# Patient Record
Sex: Female | Born: 1985
Health system: Southern US, Community
[De-identification: ages and names within clinical notes are randomized; demographics above are authoritative.]

## PROBLEM LIST (undated history)

## (undated) DIAGNOSIS — I059 Rheumatic mitral valve disease, unspecified: Secondary | ICD-10-CM

## (undated) DIAGNOSIS — R002 Palpitations: Secondary | ICD-10-CM

## (undated) DIAGNOSIS — R42 Dizziness and giddiness: Secondary | ICD-10-CM

## (undated) DIAGNOSIS — I10 Essential (primary) hypertension: Secondary | ICD-10-CM

## (undated) DIAGNOSIS — G479 Sleep disorder, unspecified: Secondary | ICD-10-CM

## (undated) HISTORY — DX: Essential (primary) hypertension: I10

## (undated) HISTORY — DX: Palpitations: R00.2

## (undated) HISTORY — DX: Rheumatic mitral valve disease, unspecified: I05.9

## (undated) HISTORY — DX: Dizziness and giddiness: R42

## (undated) HISTORY — DX: Sleep disorder, unspecified: G47.9

---
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
## 2005-08-05 ENCOUNTER — Emergency Department (HOSPITAL_COMMUNITY): Admission: EM | Admit: 2005-08-05 | Discharge: 2005-08-06 | Payer: Self-pay | Admitting: Emergency Medicine

## 2006-07-04 ENCOUNTER — Emergency Department (HOSPITAL_COMMUNITY): Admission: EM | Admit: 2006-07-04 | Discharge: 2006-07-04 | Payer: Self-pay | Admitting: Emergency Medicine

## 2006-07-10 ENCOUNTER — Emergency Department (HOSPITAL_COMMUNITY): Admission: EM | Admit: 2006-07-10 | Discharge: 2006-07-10 | Payer: Self-pay | Admitting: Family Medicine

## 2008-10-21 ENCOUNTER — Emergency Department (HOSPITAL_COMMUNITY): Admission: EM | Admit: 2008-10-21 | Discharge: 2008-10-21 | Payer: Self-pay | Admitting: Family Medicine

## 2010-03-25 ENCOUNTER — Ambulatory Visit: Payer: Self-pay | Admitting: Psychiatry

## 2010-03-25 ENCOUNTER — Inpatient Hospital Stay (HOSPITAL_COMMUNITY): Admission: RE | Admit: 2010-03-25 | Discharge: 2010-03-28 | Payer: Self-pay | Admitting: Psychiatry

## 2010-05-03 ENCOUNTER — Emergency Department (HOSPITAL_COMMUNITY): Admission: EM | Admit: 2010-05-03 | Discharge: 2010-05-03 | Payer: Self-pay | Admitting: Family Medicine

## 2010-11-30 LAB — URINE MICROSCOPIC-ADD ON

## 2010-11-30 LAB — CBC
HCT: 39.8 % (ref 36.0–46.0)
Hemoglobin: 13.9 g/dL (ref 12.0–15.0)
MCH: 33.7 pg (ref 26.0–34.0)
MCHC: 34.8 g/dL (ref 30.0–36.0)
MCV: 96.9 fL (ref 78.0–100.0)
Platelets: 215 10*3/uL (ref 150–400)
RBC: 4.11 MIL/uL (ref 3.87–5.11)
RDW: 12.6 % (ref 11.5–15.5)
WBC: 4.8 10*3/uL (ref 4.0–10.5)

## 2010-11-30 LAB — COMPREHENSIVE METABOLIC PANEL
ALT: 16 U/L (ref 0–35)
AST: 21 U/L (ref 0–37)
Albumin: 4.1 g/dL (ref 3.5–5.2)
Alkaline Phosphatase: 62 U/L (ref 39–117)
BUN: 13 mg/dL (ref 6–23)
CO2: 25 mEq/L (ref 19–32)
Calcium: 8.9 mg/dL (ref 8.4–10.5)
Chloride: 104 mEq/L (ref 96–112)
Creatinine, Ser: 0.93 mg/dL (ref 0.4–1.2)
GFR calc Af Amer: >60 mL/min (ref >60)
GFR calc non Af Amer: >60 mL/min (ref >60)
Glucose, Bld: 89 mg/dL (ref 70–99)
Potassium: 3.7 mEq/L (ref 3.5–5.1)
Sodium: 136 mEq/L (ref 135–145)
Total Bilirubin: 2.2 mg/dL — ABNORMAL HIGH (ref 0.3–1.2)
Total Protein: 7.1 g/dL (ref 6.0–8.3)

## 2010-11-30 LAB — URINALYSIS, ROUTINE W REFLEX MICROSCOPIC
Bilirubin Urine: NEGATIVE
Glucose, UA: NEGATIVE mg/dL
Hgb urine dipstick: NEGATIVE
Nitrite: NEGATIVE
Protein, ur: NEGATIVE mg/dL
Specific Gravity, Urine: 1.02 (ref 1.005–1.030)
Urobilinogen, UA: 0.2 mg/dL (ref 0.0–1.0)
pH: 6 (ref 5.0–8.0)

## 2010-11-30 LAB — URINE DRUGS OF ABUSE SCREEN W ALC, ROUTINE (REF LAB)
Amphetamine Screen, Ur: NEGATIVE
Barbiturate Quant, Ur: NEGATIVE
Benzodiazepines.: NEGATIVE
Cocaine Metabolites: NEGATIVE
Creatinine,U: 200.5 mg/dL
Ethyl Alcohol: <10 mg/dL (ref <10)
Marijuana Metabolite: NEGATIVE
Methadone: NEGATIVE
Opiate Screen, Urine: NEGATIVE
Phencyclidine (PCP): NEGATIVE
Propoxyphene: NEGATIVE

## 2010-11-30 LAB — TSH: TSH: 2.931 u[IU]/mL (ref 0.350–4.500)

## 2010-11-30 LAB — PREGNANCY, URINE: Preg Test, Ur: NEGATIVE

## 2017-02-02 ENCOUNTER — Other Ambulatory Visit (HOSPITAL_COMMUNITY)
Admission: RE | Admit: 2017-02-02 | Discharge: 2017-02-02 | Disposition: A | Discharge status: Home / Self Care | Payer: 59 | Source: Ambulatory Visit | Attending: Family Medicine | Admitting: Family Medicine

## 2017-02-02 ENCOUNTER — Other Ambulatory Visit: Payer: Self-pay | Admitting: Family Medicine

## 2017-02-02 DIAGNOSIS — Z124 Encounter for screening for malignant neoplasm of cervix: Secondary | ICD-10-CM | POA: Insufficient documentation

## 2017-02-03 DIAGNOSIS — Z13220 Encounter for screening for lipoid disorders: Secondary | ICD-10-CM | POA: Diagnosis not present

## 2017-02-03 DIAGNOSIS — Z01419 Encounter for gynecological examination (general) (routine) without abnormal findings: Secondary | ICD-10-CM | POA: Diagnosis not present

## 2017-02-04 ENCOUNTER — Other Ambulatory Visit: Payer: Self-pay | Admitting: Family Medicine

## 2017-02-04 DIAGNOSIS — N6012 Diffuse cystic mastopathy of left breast: Secondary | ICD-10-CM

## 2017-02-05 ENCOUNTER — Other Ambulatory Visit: Payer: Self-pay | Admitting: Family Medicine

## 2017-02-05 DIAGNOSIS — N6012 Diffuse cystic mastopathy of left breast: Secondary | ICD-10-CM

## 2017-02-05 LAB — CYTOLOGY - PAP
Chlamydia: NEGATIVE
Diagnosis: NEGATIVE
HPV: NOT DETECTED
Neisseria Gonorrhea: NEGATIVE

## 2017-08-04 ENCOUNTER — Ambulatory Visit
Admission: RE | Admit: 2017-08-04 | Discharge: 2017-08-04 | Disposition: A | Discharge status: Home / Self Care | Payer: 59 | Source: Ambulatory Visit | Attending: Family Medicine | Admitting: Family Medicine

## 2017-08-04 DIAGNOSIS — N6489 Other specified disorders of breast: Secondary | ICD-10-CM | POA: Diagnosis not present

## 2017-08-04 DIAGNOSIS — R922 Inconclusive mammogram: Secondary | ICD-10-CM | POA: Diagnosis not present

## 2017-08-04 DIAGNOSIS — N6012 Diffuse cystic mastopathy of left breast: Secondary | ICD-10-CM

## 2018-08-24 ENCOUNTER — Ambulatory Visit (HOSPITAL_COMMUNITY)
Admission: EM | Admit: 2018-08-24 | Discharge: 2018-08-24 | Disposition: A | Discharge status: Home / Self Care | Payer: 59 | Attending: Family Medicine | Admitting: Family Medicine

## 2018-08-24 ENCOUNTER — Encounter (HOSPITAL_COMMUNITY): Payer: Self-pay

## 2018-08-24 DIAGNOSIS — B351 Tinea unguium: Secondary | ICD-10-CM | POA: Insufficient documentation

## 2018-08-24 DIAGNOSIS — M21611 Bunion of right foot: Secondary | ICD-10-CM | POA: Insufficient documentation

## 2018-08-24 DIAGNOSIS — R21 Rash and other nonspecific skin eruption: Secondary | ICD-10-CM | POA: Insufficient documentation

## 2018-08-24 MED ORDER — NAPROXEN 500 MG PO TABS: 500.0000 mg | ORAL_TABLET | Freq: Two times a day (BID) | ORAL | 0 refills | Status: DC

## 2018-08-24 NOTE — ED Notes (Signed)
Patient able to ambulate independently  

## 2018-08-24 NOTE — ED Triage Notes (Signed)
Pt present rash that has spread on different locations of her body.  Pt present right foot swelling and painful. That started hurting two days ago.  Pt foot has been previous injured 2  years ago from a pallet falling on her foot.

## 2018-08-31 NOTE — ED Provider Notes (Signed)
[facility]   [id number] 08/24/18 Arrival Time: 1557  ASSESSMENT & PLAN:  1. Bunion of great toe of right foot   2. Rash and nonspecific skin eruption   3. Onychomycosis    Question if rash is the first sign of pityriasis rosea. Written information given. Low suspicion for scabies. Discussed.  No indication for imaging at this time concerning R foot. Low suspicion for stress fx. Likely all related to bunion.  Meds ordered this encounter  Medications  . naproxen (NAPROSYN) 500 MG tablet    Sig: Take 1 tablet (500 mg total) by mouth 2 (two) times daily.    Dispense:  20 tablet    Refill:  0  May try wearing different shoes that put less pressure on her bunion. No need for imaging of foot today. Discussed.  Recommend f/u with PCP to discuss treatment of onychomycosis if she desires. She may benefit from a podiatry referral.  Follow-up Information    Hoyt KochYousef, Deema, MD.   Specialty:  Family Medicine Why:  As needed. Contact information: [address] [place] [address] [phone]          May otherwise f/u here if needed. Reviewed expectations re: course of current medical issues. Questions answered. Outlined signs and symptoms indicating need for more acute intervention. Patient verbalized understanding. After Visit Summary given.  SUBJECTIVE: History from: patient. Lisa MillinerKendra Clarke is a 32 y.o. female who reports:  1) "A rash that I noticed a few days ago." Mostly on lower abdomen/back. Very mild itching. Has not spread rapidly. Afebrile. No new exposures to her skin. Mild cold symptoms a week or so ago. Resolved. No associated pain. No sick contacts. No h/o similar rash. No abdominal pain or urinary symptoms. No specific aggravating or alleviating factors reported. No self/OTC treatment.  2) R foot. Painful. Some swelling reported. Reports distant injury to this foot two years ago when a pallet fell onto her foot. No recent injury or trauma.  Ambulatory without difficulty. A little worse after prolonged standing/walking. Better with rest. Wearing new work boots for the past few weeks. "Takes a while to break in a new pair." No extremity sensation changes or weakness. No OTC treatment.  3) Questions discoloration of her L great toenail. Present for many months. No worsening. No pain reported. No skin changes or lesions. No injury to this toe.  History reviewed. No pertinent surgical history.   ROS: As per HPI. All other systems negative.  OBJECTIVE:  Vitals:   08/24/18 1703  BP: (!) 149/86  Pulse: 76  Resp: 16  Temp: 98.8 F (37.1 C)  TempSrc: Oral  SpO2: 99%    General appearance: alert; no distress HEENT: [place]; AT; oropharynx moist and without lesions Neck: supple without LAD Lungs: CTAB Abd: soft; non-tender Extremities: . RLE: warm and well perfused; no tenderness to palpation; obvious bunion without swelling or erythema; with no bruising; ROM CV: brisk extremity capillary refill of RLE and LLE; 2+ DP and PT pulse of RLE. . LLE: warm and well perfused; onychomycosis of L great nail; nail is not brittle and is intact; no signs of infection Skin: warm and dry; 4-5 oval, sharply delimited, pink papules on trunk oriented along skin lines of lower back; all less than 1cm in size; none on extremities or face/scalp Neurologic: gait normal; normal sensation of RLE and LLE; normal strength of RLE and LLE Psychological: alert and cooperative; normal mood and affect  Allergies  Allergen Reactions  .  Penicillins Hives   PMH: R foot injury as in HPI.  Social History   Socioeconomic History  . Marital status: Single    Spouse name: Not on file  . Number of children: Not on file  . Years of education: Not on file  . Highest education level: Not on file  Occupational History  . Not on file  Social Needs  . Financial resource strain: Not on file  . Food insecurity:    Worry: Not on file    Inability: Not on file  .  Transportation needs:    Medical: Not on file    Non-medical: Not on file  Tobacco Use  . Smoking status: Never Smoker  . Smokeless tobacco: Never Used  Substance and Sexual Activity  . Alcohol use: Never    Frequency: Never  . Drug use: Never  . Sexual activity: Yes  Lifestyle  . Physical activity:    Days per week: Not on file    Minutes per session: Not on file  . Stress: Not on file  Relationships  . Social connections:    Talks on phone: Not on file    Gets together: Not on file    Attends religious service: Not on file    Active member of club or organization: Not on file    Attends meetings of clubs or organizations: Not on file    Relationship status: Not on file  Other Topics Concern  . Not on file  Social History Narrative  . Not on file   Family History  Problem Relation Age of Onset  . Hypertension Mother    History reviewed. No pertinent surgical history.    Mardella Layman, MD 09/08/18 [phone]

## 2018-09-06 ENCOUNTER — Encounter (HOSPITAL_COMMUNITY): Payer: Self-pay | Admitting: *Deleted

## 2018-09-06 ENCOUNTER — Ambulatory Visit (HOSPITAL_COMMUNITY)
Admission: EM | Admit: 2018-09-06 | Discharge: 2018-09-06 | Disposition: A | Discharge status: Home / Self Care | Payer: 59 | Attending: Family Medicine | Admitting: Family Medicine

## 2018-09-06 DIAGNOSIS — L42 Pityriasis rosea: Secondary | ICD-10-CM | POA: Insufficient documentation

## 2018-09-06 DIAGNOSIS — B351 Tinea unguium: Secondary | ICD-10-CM | POA: Diagnosis not present

## 2018-09-06 MED ORDER — TRIAMCINOLONE ACETONIDE 0.1 % EX CREA: 1.0000 "application " | TOPICAL_CREAM | Freq: Two times a day (BID) | CUTANEOUS | 1 refills | Status: DC | PRN

## 2018-09-06 MED ORDER — TERBINAFINE HCL 250 MG PO TABS: 250.0000 mg | ORAL_TABLET | Freq: Every day | ORAL | 2 refills | Status: DC

## 2018-09-06 NOTE — ED Provider Notes (Signed)
[facility]    CSN: [id number] Arrival date & time: 09/06/18  1656     History   Chief Complaint Chief Complaint  Patient presents with  . Rash    HPI Lisa MillinerKendra Clarke is a 32 y.o. female.   [facility].  States number of lesions is now increasing and starting to become pruritic.  Patient also has a problem with her right great toenail where she dropped something heavy on it and the nail subsequently came off.  Then it became thick and difficult to trim.     History reviewed. No pertinent past medical history.  There are no active problems to display for this patient.   History reviewed. No pertinent surgical history.  OB History   No obstetric history on file.      Home Medications    Prior to Admission medications   Medication Sig Start Date End Date Taking? Authorizing Provider  terbinafine (LAMISIL) 250 MG tablet Take 1 tablet (250 mg total) by mouth daily. 09/06/18   Elvina SidleLauenstein, Pollyann Roa, MD  triamcinolone cream (KENALOG) 0.1 % Apply 1 application topically 2 (two) times daily as needed. 09/06/18   Elvina SidleLauenstein, Alphons Burgert, MD    Family History Family History  Problem Relation Age of Onset  . Hypertension Mother     Social History Social History   Tobacco Use  . Smoking status: Never Smoker  . Smokeless tobacco: Never Used  Substance Use Topics  . Alcohol use: Never    Frequency: Never  . Drug use: Never     Allergies   Penicillins   Review of Systems Review of Systems   Physical Exam Triage Vital Signs ED Triage Vitals [09/06/18 1715]  Enc Vitals Group     BP (!) 156/82     Pulse Rate 67     Resp 16     Temp 98.3 F (36.8 C)     Temp Source Oral     SpO2 100 %     Weight      Height      Head Circumference      Peak Flow      Pain Score 0     Pain Loc      Pain Edu?      Excl. in GC?    No data found.  Updated Vital Signs BP (!) 156/82   Pulse 67   Temp 98.3 F (36.8 C) (Oral)   Resp 16   LMP  08/23/2018 (Approximate)   SpO2 100%   Physical Exam Vitals signs and nursing note reviewed.  Constitutional:      Appearance: Normal appearance.  Eyes:     Conjunctiva/sclera: Conjunctivae normal.  Pulmonary:     Effort: Pulmonary effort is normal.  Musculoskeletal: Normal range of motion.  Skin:    Findings: Rash present.     Comments: Great toenail is thickened and deformed on the right  Neurological:     General: No focal deficit present.     Mental Status: She is alert.  Psychiatric:        Mood and Affect: Mood normal.        UC Treatments / Results  Labs (all labs ordered are listed, but only abnormal results are displayed) Labs Reviewed - No data to display  EKG None  Radiology No results found.  Procedures Procedures (including critical care time)  Medications Ordered in UC Medications - No data to display  Initial Impression / Assessment and  Plan / UC Course  I have reviewed the triage vital signs and the nursing notes.  Pertinent labs & imaging results that were available during my care of the patient were reviewed by me and considered in my medical decision making (see chart for details).    Final Clinical Impressions(s) / UC Diagnoses   Final diagnoses:  Tinea unguium  Pityriasis rosea   Discharge Instructions   None    ED Prescriptions    Medication Sig Dispense Auth. Provider   terbinafine (LAMISIL) 250 MG tablet Take 1 tablet (250 mg total) by mouth daily. 30 tablet Elvina SidleLauenstein, Liliya Fullenwider, MD   triamcinolone cream (KENALOG) 0.1 % Apply 1 application topically 2 (two) times daily as needed. 80 g Elvina SidleLauenstein, Donda Friedli, MD     Controlled Substance Prescriptions  Controlled Substance Registry consulted? Not Applicable   Elvina SidleLauenstein, Mitcheal Sweetin, MD 09/06/18 1742

## 2018-09-06 NOTE — ED Triage Notes (Addendum)
[facility].  States number of lesions is now increasing and starting to become pruritic.

## 2019-03-03 ENCOUNTER — Other Ambulatory Visit: Payer: Self-pay

## 2019-03-03 DIAGNOSIS — Z20822 Contact with and (suspected) exposure to covid-19: Secondary | ICD-10-CM

## 2019-03-03 NOTE — Progress Notes (Signed)
lab

## 2019-03-05 LAB — NOVEL CORONAVIRUS, NAA: SARS-CoV-2, NAA: NOT DETECTED

## 2020-02-29 ENCOUNTER — Encounter: Payer: Self-pay | Admitting: General Practice

## 2020-05-14 ENCOUNTER — Encounter: Payer: Self-pay | Admitting: Cardiology

## 2020-05-14 ENCOUNTER — Ambulatory Visit: Payer: 59 | Admitting: Cardiology

## 2020-05-14 ENCOUNTER — Telehealth: Payer: Self-pay | Admitting: Radiology

## 2020-05-14 ENCOUNTER — Other Ambulatory Visit: Payer: Self-pay

## 2020-05-14 VITALS — BP 133/81 | HR 68 | Ht 64.0 in | Wt 139.4 lb

## 2020-05-14 DIAGNOSIS — I10 Essential (primary) hypertension: Secondary | ICD-10-CM

## 2020-05-14 DIAGNOSIS — R072 Precordial pain: Secondary | ICD-10-CM | POA: Diagnosis not present

## 2020-05-14 DIAGNOSIS — R002 Palpitations: Secondary | ICD-10-CM | POA: Diagnosis not present

## 2020-05-14 DIAGNOSIS — R42 Dizziness and giddiness: Secondary | ICD-10-CM

## 2020-05-14 DIAGNOSIS — R0602 Shortness of breath: Secondary | ICD-10-CM | POA: Diagnosis not present

## 2020-05-14 NOTE — Progress Notes (Signed)
Cardiology Consult Note    Date:  05/14/2020   ID:  Lisa Clarke, DOB 20-Aug-1986, MRN [id number]  PCP:  Lisa Koch, MD (Inactive)  Cardiologist:  Lisa Magic, MD   Chief Complaint  Patient presents with  . New Patient (Initial Visit)    Palpitations, dizziness, CP and SOB    History of Present Illness:  Lisa Clarke is a 34 y.o. female who is being seen today for the evaluation of dizziness and palpitations at the request of Lisa Beams, MD.  This is a 34yo AAF with a hx of HTN who is referred for evaluation of dizzy spells and palpitations.  She tells me that she has been having dizziness for some time.  It mainly occurs when she changes positions especially if bending over.  It is a spinning sensation that makes her feel bad.  She says that the room will start to spin fast.  She also has been having problems with chest discomfort that she describes as an ache.  She works out a lot and does certain exercises that strain her chest wall.  Her chest discomfort is worse with chest wall movements.  She can have the pain at night and during the day and is nonexertional.  She also has been having problems with SOB that also is nonexertional and can occur just sitting.  She has a fm hx of her maternal GF having CAD but no other relative and she has never smoked. She also says that she has been having palpitations that were quite frequent a few weeks ago but have improved recently.  Past Medical History:  Diagnosis Date  . Dizzy spells   . Hypertension   . Palpitations   . Sleep disturbance     No past surgical history on file.  Current Medications: Current Meds  Medication Sig  . AMLODIPINE BESYLATE PO Take 2.5 mg by mouth daily.     Allergies:   Penicillins   Social History   Socioeconomic History  . Marital status: Single    Spouse name: Not on file  . Number of children: Not on file  . Years of education: Not on file  . Highest education level: Not on file   Occupational History  . Not on file  Tobacco Use  . Smoking status: Never Smoker  . Smokeless tobacco: Never Used  Substance and Sexual Activity  . Alcohol use: Never  . Drug use: Never  . Sexual activity: Yes  Other Topics Concern  . Not on file  Social History Narrative  . Not on file   Social Determinants of Health   Financial Resource Strain:   . Difficulty of Paying Living Expenses: Not on file  Food Insecurity:   . Worried About Programme researcher, broadcasting/film/video in the Last Year: Not on file  . Ran Out of Food in the Last Year: Not on file  Transportation Needs:   . Lack of Transportation (Medical): Not on file  . Lack of Transportation (Non-Medical): Not on file  Physical Activity:   . Days of Exercise per Week: Not on file  . Minutes of Exercise per Session: Not on file  Stress:   . Feeling of Stress : Not on file  Social Connections:   . Frequency of Communication with Friends and Family: Not on file  . Frequency of Social Gatherings with Friends and Family: Not on file  . Attends Religious Services: Not on file  . Active Member of Clubs or Organizations: Not on  file  . Attends Banker Meetings: Not on file  . Marital Status: Not on file     Family History:  The patient's family history includes Hypertension in her mother.   ROS:   Please see the history of present illness.    ROS All other systems reviewed and are negative.  No flowsheet data found.     PHYSICAL EXAM:   VS:  BP 133/81   Pulse 68   Ht 5\' 4"  (1.626 m)   Wt 139 lb 6.4 oz (63.2 kg)   BMI 23.93 kg/m    GEN: Well nourished, well developed, in no acute distress  HEENT: normal  Neck: no JVD, carotid bruits, or masses Cardiac: RRR; no murmurs, rubs, or gallops,no edema.  Intact distal pulses bilaterally.  Respiratory:  clear to auscultation bilaterally, normal work of breathing GI: soft, nontender, nondistended, + BS MS: no deformity or atrophy  Skin: warm and dry, no rash Neuro:   Alert and Oriented x 3, Strength and sensation are intact Psych: euthymic mood, full affect  Wt Readings from Last 3 Encounters:  05/14/20 139 lb 6.4 oz (63.2 kg)      Studies/Labs Reviewed:   EKG:  EKG is ordered today.  The ekg ordered today demonstrates NSR with no ST changes  Recent Labs: No results found for requested labs within last 8760 hours.   Lipid Panel No results found for: CHOL, TRIG, HDL, CHOLHDL, VLDL, LDLCALC, LDLDIRECT  Additional studies/ records that were reviewed today include:  OV notes fromPCP    ASSESSMENT:    1. Palpitations   2. Dizziness   3. Essential hypertension   4. Precordial pain   5. Shortness of breath      PLAN:  In order of problems listed above:  1. Palpitations -I will check a 30 day event monitor to assess for arrhythmias -they have decreased in frequency so I am concerned if we do a 2 weeks monitor we may not catch them  2.  Dizziness -her sx are c/w vertigo and quite symptomatic -I will refer her to ENT for evaluation  3.  HTN -BP controlled on exam -continue amlodipine 2.5mg  daily  4.  Atypical chest pain -I think this is related to costochondritis from weight lifting -I have recommended that she cut back on exercises that strain her chest wall for now -I will get an ETT to rule out ischemia but doubt this is cardiac  5.  SOB -this occurs at rest and unclear what may be causing this -her lungs are clear and she has never smoked and no hx of asthma -will get a 2D echo to assess LVF and rule out valvular abnormalites    Medication Adjustments/Labs and Tests Ordered: Current medicines are reviewed at length with the patient today.  Concerns regarding medicines are outlined above.  Medication changes, Labs and Tests ordered today are listed in the Patient Instructions below.  Patient Instructions  Medication Instructions:  Your physician recommends that you continue on your current medications as directed. Please  refer to the Current Medication list given to you today.  *If you need a refill on your cardiac medications before your next appointment, please call your pharmacy*   Testing/Procedures: Your physician has requested that you have an echocardiogram. Echocardiography is a painless test that uses sound waves to create images of your heart. It provides your doctor with information about the size and shape of your heart and how well your heart's chambers and valves  are working. This procedure takes approximately one hour. There are no restrictions for this procedure.  Your physician has requested that you have an exercise tolerance test. For further information please visit [email or web address] Please also follow instruction sheet, as given.  Your physician has recommended that you wear an event monitor. Event monitors are medical devices that record the heart's electrical activity. Doctors most often [place] these monitors to diagnose arrhythmias. Arrhythmias are problems with the speed or rhythm of the heartbeat. The monitor is a small, portable device. You can wear one while you do your normal daily activities. This is usually used to diagnose what is causing palpitations/syncope (passing out).   Follow-Up: At [facility], you and your health needs are our priority.  As part of our continuing mission to provide you with exceptional heart care, we have created designated Provider Care Teams.  These Care Teams include your primary Cardiologist (physician) and Advanced Practice Providers (APPs -  Physician Assistants and Nurse Practitioners) who all work together to provide you with the care you need, when you need it.  We recommend signing up for the patient portal called "MyChart".  Sign up information is provided on this After Visit Summary.  MyChart is used to connect with patients for Virtual Visits (Telemedicine).  Patients are able to view lab/test results, encounter notes, upcoming appointments,  etc.  Non-urgent messages can be sent to your provider as well.   To learn more about what you can do with MyChart, go to [email or web address].    Follow up with Dr. Mayford Knife as needed based on results of testing.    Other Instructions You have been referred to an Ear, Nose and Throat Specialist.       Signed, Lisa Magic, MD  05/14/2020 12:25 PM    [facility] [address] [place], [place], [place]  [address] Phone: [phone]; Fax: [phone]

## 2020-05-14 NOTE — Telephone Encounter (Signed)
Enrolled patient for a 30 day [employer] Event Monitor to be mailed to patients home  

## 2020-05-14 NOTE — Patient Instructions (Addendum)
Medication Instructions:  Your physician recommends that you continue on your current medications as directed. Please refer to the Current Medication list given to you today.  *If you need a refill on your cardiac medications before your next appointment, please call your pharmacy*   Testing/Procedures: Your physician has requested that you have an echocardiogram. Echocardiography is a painless test that uses sound waves to create images of your heart. It provides your doctor with information about the size and shape of your heart and how well your heart's chambers and valves are working. This procedure takes approximately one hour. There are no restrictions for this procedure.  Your physician has requested that you have an exercise tolerance test. For further information please visit [email or web address] Please also follow instruction sheet, as given.  Your physician has recommended that you wear an event monitor. Event monitors are medical devices that record the heart's electrical activity. Doctors most often [place] these monitors to diagnose arrhythmias. Arrhythmias are problems with the speed or rhythm of the heartbeat. The monitor is a small, portable device. You can wear one while you do your normal daily activities. This is usually used to diagnose what is causing palpitations/syncope (passing out).   Follow-Up: At [facility], you and your health needs are our priority.  As part of our continuing mission to provide you with exceptional heart care, we have created designated Provider Care Teams.  These Care Teams include your primary Cardiologist (physician) and Advanced Practice Providers (APPs -  Physician Assistants and Nurse Practitioners) who all work together to provide you with the care you need, when you need it.  We recommend signing up for the patient portal called "MyChart".  Sign up information is provided on this After Visit Summary.  MyChart is used to connect with patients for  Virtual Visits (Telemedicine).  Patients are able to view lab/test results, encounter notes, upcoming appointments, etc.  Non-urgent messages can be sent to your provider as well.   To learn more about what you can do with MyChart, go to [email or web address].    Follow up with Dr. Mayford Knife as needed based on results of testing.    Other Instructions You have been referred to an Ear, Nose and Throat Specialist.

## 2020-05-19 ENCOUNTER — Encounter (INDEPENDENT_AMBULATORY_CARE_PROVIDER_SITE_OTHER): Payer: 59

## 2020-05-19 DIAGNOSIS — R002 Palpitations: Secondary | ICD-10-CM | POA: Diagnosis not present

## 2020-05-23 NOTE — Telephone Encounter (Signed)
Will route this message to our monitor techs to further assist the pt with her monitor/electrode issues.

## 2020-05-23 NOTE — Telephone Encounter (Signed)
Patient is following up. She states the area where her monitor is located is extremely red, bumpy, and irritated. In addition, the adhesive strips are no longer sticking to her skin. She assumes it may not be sticking because she has naturally oily skin. Please return call to further discuss.

## 2020-06-07 ENCOUNTER — Other Ambulatory Visit (HOSPITAL_COMMUNITY)
Admission: RE | Admit: 2020-06-07 | Discharge: 2020-06-07 | Disposition: A | Discharge status: Home / Self Care | Payer: 59 | Source: Ambulatory Visit | Attending: Cardiology | Admitting: Cardiology

## 2020-06-07 DIAGNOSIS — Z01812 Encounter for preprocedural laboratory examination: Secondary | ICD-10-CM | POA: Insufficient documentation

## 2020-06-07 DIAGNOSIS — Z20822 Contact with and (suspected) exposure to covid-19: Secondary | ICD-10-CM | POA: Insufficient documentation

## 2020-06-07 LAB — SARS CORONAVIRUS 2 (TAT 6-24 HRS): SARS Coronavirus 2: NEGATIVE

## 2020-06-11 ENCOUNTER — Other Ambulatory Visit: Payer: Self-pay

## 2020-06-11 ENCOUNTER — Encounter: Payer: Self-pay | Admitting: Cardiology

## 2020-06-11 ENCOUNTER — Ambulatory Visit (HOSPITAL_COMMUNITY): Payer: 59 | Attending: Cardiology

## 2020-06-11 ENCOUNTER — Ambulatory Visit (INDEPENDENT_AMBULATORY_CARE_PROVIDER_SITE_OTHER): Payer: 59

## 2020-06-11 DIAGNOSIS — R0602 Shortness of breath: Secondary | ICD-10-CM

## 2020-06-11 DIAGNOSIS — I059 Rheumatic mitral valve disease, unspecified: Secondary | ICD-10-CM | POA: Insufficient documentation

## 2020-06-11 DIAGNOSIS — R072 Precordial pain: Secondary | ICD-10-CM

## 2020-06-11 LAB — EXERCISE TOLERANCE TEST
Estimated workload: 13.7 METS
Exercise duration (min): 12 min
Exercise duration (sec): 0 s
MPHR: 186 {beats}/min
Peak HR: 171 {beats}/min
Percent HR: 91 %
RPE: 17
Rest HR: 62 {beats}/min

## 2020-06-11 LAB — ECHOCARDIOGRAM COMPLETE
Area-P 1/2: 3.37 cm2
MV M vel: 5.15 m/s
MV Peak grad: 106.1 mmHg
Radius: 0.3 cm
S' Lateral: 2.7 cm

## 2020-06-12 ENCOUNTER — Telehealth: Payer: Self-pay

## 2020-06-12 DIAGNOSIS — I10 Essential (primary) hypertension: Secondary | ICD-10-CM

## 2020-06-12 DIAGNOSIS — I059 Rheumatic mitral valve disease, unspecified: Secondary | ICD-10-CM

## 2020-06-12 DIAGNOSIS — R0602 Shortness of breath: Secondary | ICD-10-CM

## 2020-06-12 NOTE — Telephone Encounter (Signed)
-----   Message from Quintella Reichert, MD sent at 06/11/2020  7:47 PM EDT ----- Echo showed normal heart function with mildly leaky MV and possible MV prolapse.  Repeat echo in 1 year

## 2020-06-12 NOTE — Telephone Encounter (Signed)
BP monitor has been ordered. Patient will call back next week to schedule follow up appointment once she has her work schedule.

## 2020-06-12 NOTE — Telephone Encounter (Signed)
-----   Message from Quintella Reichert, MD sent at 06/12/2020  1:25 PM EDT ----- SOB is likely related to her poorly controlled BP.  Lets see what 48 hour BP monitor shows and get in with PA in 4 weeks ----- Message ----- From: Theresia Majors, RN Sent: 06/12/2020  11:42 AM EDT To: Quintella Reichert, MD

## 2020-06-12 NOTE — Telephone Encounter (Signed)
The patient has been notified of the result and verbalized understanding.  All questions (if any) were answered. Theresia Majors, RN 06/12/2020 11:43 AM

## 2020-06-13 ENCOUNTER — Telehealth: Payer: Self-pay | Admitting: *Deleted

## 2020-06-13 NOTE — Telephone Encounter (Signed)
Appointment for 24 hour ambulatory blood pressure monitor made for 06/19/2020 at 1:00PM.  The patient will call to cancel if she is scheduled to work.

## 2020-06-19 ENCOUNTER — Ambulatory Visit (INDEPENDENT_AMBULATORY_CARE_PROVIDER_SITE_OTHER): Payer: 59

## 2020-06-19 ENCOUNTER — Other Ambulatory Visit: Payer: Self-pay

## 2020-06-19 DIAGNOSIS — I10 Essential (primary) hypertension: Secondary | ICD-10-CM | POA: Diagnosis not present

## 2020-06-28 ENCOUNTER — Telehealth: Payer: Self-pay | Admitting: *Deleted

## 2020-06-28 DIAGNOSIS — I10 Essential (primary) hypertension: Secondary | ICD-10-CM

## 2020-06-28 DIAGNOSIS — R072 Precordial pain: Secondary | ICD-10-CM

## 2020-06-28 DIAGNOSIS — I472 Ventricular tachycardia: Secondary | ICD-10-CM

## 2020-06-28 DIAGNOSIS — I4729 Other ventricular tachycardia: Secondary | ICD-10-CM

## 2020-06-28 DIAGNOSIS — R0602 Shortness of breath: Secondary | ICD-10-CM

## 2020-06-28 NOTE — Telephone Encounter (Signed)
-----   Message from Lars Masson, MD sent at 06/23/2020  5:08 PM EDT ----- Predominant rhythm was sinus rhythm. One episode of non-sustained ventricular tachycardia. A cardiac MRI is recommended.

## 2020-06-28 NOTE — Telephone Encounter (Signed)
I spoke with patient and reviewed monitor results with her.  She would like to proceed with cardiac MRI.  She tested positive for covid on 10/9 and is currently isolating so cannot schedule yet.  I told her it would need to be approved by her insurance and then we would contact her with appointment information

## 2020-07-04 ENCOUNTER — Encounter: Payer: Self-pay | Admitting: Cardiology

## 2020-07-04 ENCOUNTER — Telehealth: Payer: Self-pay | Admitting: Cardiology

## 2020-07-04 NOTE — Telephone Encounter (Signed)
Spoke with patient regarding appointment for Cardiac MRI scheduled Wednesday 07/24/20 at 12:00pm at [facility]----arrival time is 11:30 am 1st floor admissions office.  Will mail information to patient and she voiced her understanding.

## 2020-07-12 ENCOUNTER — Telehealth: Payer: Self-pay | Admitting: Cardiology

## 2020-07-12 NOTE — Telephone Encounter (Signed)
Pt called [name] RN back and requested to send her result per Dr. Mayford Knife, via [place] message.  Sent the pt a mychart message that per Dr. Mayford Knife, her BP readings were normal and she wanted to ask if she is still having SOB.  Will await pts response in mychart.

## 2020-07-12 NOTE — Telephone Encounter (Signed)
    Pt is returning [name]'s call. She said she is at work and just send her a Wellsite geologist

## 2020-07-12 NOTE — Telephone Encounter (Signed)
-----   Message from Quintella Reichert, MD sent at 07/11/2020  2:40 PM EDT ----- Normal BP readings - pease find out if patient is still SOB

## 2020-07-15 NOTE — Telephone Encounter (Signed)
Cardiac MRI was recommended but was not done - please order this and also put a note in to assess for anomalous coronary artery

## 2020-07-15 NOTE — Telephone Encounter (Signed)
Cardiac MRI scheduled for 11/10

## 2020-07-15 NOTE — Telephone Encounter (Signed)
Left message for patient to call back  

## 2020-07-17 ENCOUNTER — Telehealth: Payer: Self-pay | Admitting: Cardiology

## 2020-07-17 MED ORDER — AMLODIPINE BESYLATE 2.5 MG PO TABS: 2.5000 mg | ORAL_TABLET | Freq: Every day | ORAL | 3 refills | Status: DC

## 2020-07-17 NOTE — Telephone Encounter (Signed)
*  STAT* If patient is at the pharmacy, call can be transferred to refill team.   1. Which medications need to be refilled? (please list name of each medication and dose if known)  AMLODIPINE BESYLATE PO  2. Which pharmacy/location (including street and city if local pharmacy) is medication to be sent to? [id number] - [place] - [address]  3. Do they need a 30 day or 90 day supply? 90 with refills

## 2020-07-17 NOTE — Telephone Encounter (Signed)
Spoke with the patient who states that she is still experiencing some intermittent chest tightness. She states that it does not radiate. She also reports she has been very stressed recently. She denies any SOB, dizziness, palpitations, N/V, or diaphoresis.  She states that she has stopped taking amlodipine because she has not been back to see her PCP so they would not refill it. She is working on paying off a bill so that she can be seen by her PCP.  She states BP has been around 140/86. She states that when she was exercising the other day her BP got up to 200/? but she stopped and BP went back down to 140s/?.  Patient has cardiac MRI scheduled for 11/10.

## 2020-07-17 NOTE — Telephone Encounter (Signed)
Ok to refill Amlodipine

## 2020-07-17 NOTE — Telephone Encounter (Signed)
   Pt c/o of Chest Pain: STAT if CP now or developed within 24 hours  1. Are you having CP right now? No  2. Are you experiencing any other symptoms (ex. SOB, nausea, vomiting, sweating)? Sob   3. How long have you been experiencing CP? Daily for couple of days  4. Is your CP continuous or coming and going? Coming and going   5. Have you taken Nitroglycerin? No   Pt said her CP and SOB getting worst everyday, especially after she works out she feels the chest tightness or even without doing anything she will feel CP. She said she is not taking any BP meds right now. She said if [name] can call her back or Dr. Mayford Knife or APP. ?

## 2020-07-17 NOTE — Telephone Encounter (Signed)
Left message for patient to call back  

## 2020-07-17 NOTE — Telephone Encounter (Signed)
Pt's medication was already sent to pt's pharmacy as requested. Confirmation received.  

## 2020-07-23 ENCOUNTER — Telehealth (HOSPITAL_COMMUNITY): Payer: Self-pay | Admitting: Emergency Medicine

## 2020-07-23 NOTE — Telephone Encounter (Signed)
Attempted to call patient regarding upcoming cardiac MR appointment. Left message on voicemail with name and callback number Joby Hershkowitz RN Navigator Cardiac Imaging [facility] [phone] Office [phone] Cell  

## 2020-07-24 ENCOUNTER — Telehealth (HOSPITAL_COMMUNITY): Payer: Self-pay | Admitting: Emergency Medicine

## 2020-07-24 ENCOUNTER — Ambulatory Visit (HOSPITAL_COMMUNITY): Admission: RE | Admit: 2020-07-24 | Payer: 59 | Source: Ambulatory Visit

## 2020-07-24 DIAGNOSIS — R072 Precordial pain: Secondary | ICD-10-CM

## 2020-07-24 DIAGNOSIS — R0602 Shortness of breath: Secondary | ICD-10-CM

## 2020-07-24 DIAGNOSIS — I059 Rheumatic mitral valve disease, unspecified: Secondary | ICD-10-CM

## 2020-07-24 DIAGNOSIS — I4729 Other ventricular tachycardia: Secondary | ICD-10-CM

## 2020-07-24 DIAGNOSIS — I472 Ventricular tachycardia: Secondary | ICD-10-CM

## 2020-07-24 NOTE — Telephone Encounter (Signed)
Calling to cancel patient CMR today per KN  Cardiac MRi wouldn't be a modality of choice as the thinnest slices we provide are 5-6 mm and we can easily miss coronary arteries. Cardiac CT would be much better option.   Patient did not answer. LMTCB  Rockwell Alexandria RN Navigator Cardiac Imaging [facility] [phone] Office  [phone] Cell   Order placed for cardiac CTA

## 2020-07-24 NOTE — Telephone Encounter (Signed)
Lisa Clarke - I talked with Dr. Delton See and we are going to cancel the CTA and get a 2D echo to see if we can see the coronary arteries on echo.    Lisa Clarke>>please order a limited 2D echo to view ostia of the coronary arteries - please have Toma Copier do the scan

## 2020-07-24 NOTE — Addendum Note (Signed)
Addended by: Theresia Majors on: 07/24/2020 12:57 PM   Modules accepted: Orders

## 2020-07-24 NOTE — Telephone Encounter (Signed)
Orders have been placed for limited echo.   Spoke with the patient and made her aware that Dr. Mayford Knife and Dr. Delton See decided that she needed an echocardiogram rather than CTA.  I have her scheduled for 12/8.

## 2020-08-21 ENCOUNTER — Ambulatory Visit (HOSPITAL_COMMUNITY): Payer: 59 | Attending: Internal Medicine

## 2020-08-21 ENCOUNTER — Other Ambulatory Visit: Payer: Self-pay

## 2020-08-21 DIAGNOSIS — I472 Ventricular tachycardia: Secondary | ICD-10-CM | POA: Diagnosis not present

## 2020-08-21 DIAGNOSIS — R072 Precordial pain: Secondary | ICD-10-CM | POA: Diagnosis not present

## 2020-08-21 DIAGNOSIS — I4729 Other ventricular tachycardia: Secondary | ICD-10-CM

## 2020-08-21 DIAGNOSIS — R0602 Shortness of breath: Secondary | ICD-10-CM | POA: Diagnosis not present

## 2020-08-21 LAB — ECHOCARDIOGRAM LIMITED
Area-P 1/2: 3.51 cm2
S' Lateral: 2.4 cm

## 2020-10-25 ENCOUNTER — Ambulatory Visit: Payer: 59 | Admitting: Cardiology

## 2020-10-25 ENCOUNTER — Other Ambulatory Visit: Payer: Self-pay

## 2020-10-25 ENCOUNTER — Encounter: Payer: Self-pay | Admitting: Cardiology

## 2020-10-25 VITALS — BP 132/80 | HR 70 | Ht 64.0 in | Wt 146.6 lb

## 2020-10-25 DIAGNOSIS — I10 Essential (primary) hypertension: Secondary | ICD-10-CM

## 2020-10-25 DIAGNOSIS — R072 Precordial pain: Secondary | ICD-10-CM

## 2020-10-25 MED ORDER — METOPROLOL TARTRATE 25 MG PO TABS: 25.0000 mg | ORAL_TABLET | Freq: Two times a day (BID) | ORAL | 3 refills | Status: DC

## 2020-10-25 NOTE — Telephone Encounter (Signed)
Called patient back about message. Patient stated she has been having chest pain and elevated BP. Patient stated it has improved since this morning. Patient also has elevated BP's that are getting better. Patient's ETT was normal in September 2022, and echo in December was normal. Dr. Mayford Knife had commented on results that CP is noncardiac. Will have patient come in today to see DOD, Dr. Shari Prows to be evaluated.  Patient agreed to plan.

## 2020-10-25 NOTE — Patient Instructions (Addendum)
Medication Instructions:  The current medical regimen is effective;  continue present plan and medications.  *If you need a refill on your cardiac medications before your next appointment, please call your pharmacy*  Follow-Up: At [facility], you and your health needs are our priority.  As part of our continuing mission to provide you with exceptional heart care, we have created designated Provider Care Teams.  These Care Teams include your primary Cardiologist (physician) and Advanced Practice Providers (APPs -  Physician Assistants and Nurse Practitioners) who all work together to provide you with the care you need, when you need it.  We recommend signing up for the patient portal called "MyChart".  Sign up information is provided on this After Visit Summary.  MyChart is used to connect with patients for Virtual Visits (Telemedicine).  Patients are able to view lab/test results, encounter notes, upcoming appointments, etc.  Non-urgent messages can be sent to your provider as well.   To learn more about what you can do with MyChart, go to [email or web address].    Your next appointment:   1-2  month(s)  The format for your next appointment:   In Person  Provider:   Armanda Magic, MD  Thank you for choosing [facility]!!

## 2020-10-25 NOTE — Progress Notes (Signed)
Cardiology Office Note:    Date:  10/25/2020   ID:  Lisa Clarke, DOB 1986/03/23, MRN [id number]  PCP:  [facility]   [facility]  Cardiologist:  Armanda Magic, MD  Advanced Practice Provider:  No care team member to display Electrophysiologist:  None   Referring MD: [facility]   No chief complaint on file.   History of Present Illness:    Lisa Clarke is a 35 y.o. female with a hx of HTN and possible anterior MVP who is usually followed by Dr. Mayford Knife, but is being seen more urgently today due to hypertension and chest pain.  Last saw Dr. Mayford Knife for atypical chest pain on 05/14/20. TTE showed normal BiV function, possible anterior MVP with mild MR. Exercise stress test normal with no ischemic changes.   Today, the patient states that she has severe chest pain that is sharp in nature that has been ongoing for the past week. Pain is intermittent in nature and is worsened by movement, deep breathing, coughing, and laying in certain positions. Not exertional in nature. Has also noted that her blood pressure has also been elevated and she feels more short winded when SBPs are high. Highest blood pressure at home has been . She states it is usually high when her chest pain is worse. Today blood pressure is improved to 130s. No orthopnea, PND, LE edema.  Of note, she is very active and lifts weights often. Has been recently doing chest press and flies. Has not exercised in a couple of days due to the pain in her chest.   She also states that she does not want to increase her amlodipine as her mom is concerned that it will cause blood clots.   Past Medical History:  Diagnosis Date  . Dizzy spells   . Hypertension   . Mitral valve disease    2D echo 05/2020 with mildly posteriorly directed MR and possible anterior MVP and normal LVF  . Palpitations   . Sleep disturbance     History reviewed. No pertinent surgical  history.  Current Medications: Current Meds  Medication Sig  . amLODipine (NORVASC) 2.5 MG tablet Take 1 tablet (2.5 mg total) by mouth daily.  . [DISCONTINUED] metoprolol tartrate (LOPRESSOR) 25 MG tablet Take 1 tablet (25 mg total) by mouth 2 (two) times daily.     Allergies:   Penicillins   Social History   Socioeconomic History  . Marital status: Single    Spouse name: Not on file  . Number of children: Not on file  . Years of education: Not on file  . Highest education level: Not on file  Occupational History  . Not on file  Tobacco Use  . Smoking status: Never Smoker  . Smokeless tobacco: Never Used  Substance and Sexual Activity  . Alcohol use: Never  . Drug use: Never  . Sexual activity: Yes  Other Topics Concern  . Not on file  Social History Narrative  . Not on file   Social Determinants of Health   Financial Resource Strain: Not on file  Food Insecurity: Not on file  Transportation Needs: Not on file  Physical Activity: Not on file  Stress: Not on file  Social Connections: Not on file     Family History: The patient's family history includes Hypertension in her mother.  ROS:   Please see the history of present illness.    Review of Systems  Constitutional: Positive for  malaise/fatigue. Negative for chills and fever.  HENT: Negative for nosebleeds.   Eyes: Negative for blurred vision and redness.  Respiratory: Positive for shortness of breath.   Cardiovascular: Positive for chest pain. Negative for palpitations, orthopnea, claudication, leg swelling and PND.  Gastrointestinal: Negative for melena, nausea and vomiting.  Genitourinary: Negative for hematuria.  Musculoskeletal: Positive for myalgias.  Neurological: Negative for dizziness and loss of consciousness.  Endo/Heme/Allergies: Negative for polydipsia.  Psychiatric/Behavioral: Negative for substance abuse.    EKGs/Labs/Other Studies Reviewed:    The following studies were reviewed  today: Exercise stress test 05/2020:  Blood pressure demonstrated a hypertensive response to exercise.  Upsloping ST segment depression ST segment depression was noted during stress in the II, III, aVF, V4, V5 and V6 leads, and returning to baseline after less than 1 minute of recovery.  No T wave inversion was noted during stress.  Excellent exercise capacity.  Negative, adequate stress test.    TTE 08/21/20: IMPRESSIONS  1. Normal appearing left main coronary artery origin and right coronary  artery origin.  2. Left ventricular ejection fraction, by estimation, is 60 to 65%. The  left ventricle has normal function. Left ventricular diastolic parameters  were normal.  3. 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 Mild mitral valve regurgitation.  4. The aortic valve is tricuspid.    EKG:  EKG is  ordered today.  The ekg ordered today demonstrates NSR with HR 70  Recent Labs: No results found for requested labs within last 8760 hours.  Recent Lipid Panel No results found for: CHOL, TRIG, HDL, CHOLHDL, VLDL, LDLCALC, LDLDIRECT    Physical Exam:    VS:  BP 132/80   Pulse 70   Ht 5\' 4"  (1.626 m)   Wt 146 lb 9.6 oz (66.5 kg)   SpO2 98%   BMI 25.16 kg/m     Wt Readings from Last 3 Encounters:  10/25/20 146 lb 9.6 oz (66.5 kg)  05/14/20 139 lb 6.4 oz (63.2 kg)     GEN:  Well nourished, well developed in no acute distress HEENT: Normal NECK: No JVD; No carotid bruits CARDIAC: RRR, no murmurs, rubs, gallops. Mild tenderness to palpation of the chest RESPIRATORY:  Clear to auscultation without rales, wheezing or rhonchi  ABDOMEN: Soft, non-tender, non-distended MUSCULOSKELETAL:  No edema; No deformity  SKIN: Warm and dry NEUROLOGIC:  Alert and oriented x 3 PSYCHIATRIC:  Normal affect   ASSESSMENT:    1. Precordial pain   2. Primary hypertension    PLAN:    In order of problems listed  above:  #Non-Cardiac Chest Pain: Patient presents with severe, stabbing chest pain that is worse with certain movements, cough, deep inspiration and certain positions that has been ongoing for several days. Symptoms not consistent with cardiac etiology and recent TTE and exercise stress test normal. Suspect MSK given patient lifts weights including performing several chest exercises. Recommended symptomatic treatment with advil and tylenol with no further cardiac work-up at this time.  -Symptomatic management with NSAIDs and tylenol -Avoid chest exercises until pain improves and then start lower weight and less reps for a while to ensure she does not reinjure herself -Follow-up with PCP as scheduled  #Hypertension: Blood pressure usually 120s-130s at home but has been elevated in the setting of severe pain. Discussed that her elevated blood pressure is likely driven by her pain. If continues to be elevated despite pain improvement, recommended increasing amlodipine to 5mg   daily. Assured her that this will not increase her risk of clots. She would like to avoid increasing the dose/adding a different medication at this time. -Elevated BP likely driven by pain as above -If remains elevated despite better pain control, increase amlodipine to 5mg  daily -Reassured her that amlodipine should not increase her risk of clots -Will arrange for follow-up with primary Cardiologist, Dr.Turner for further management   Medication Adjustments/Labs and Tests Ordered: Current medicines are reviewed at length with the patient today.  Concerns regarding medicines are outlined above.  Orders Placed This Encounter  Procedures  . EKG 12-Lead   Meds ordered this encounter  Medications  . DISCONTD: metoprolol tartrate (LOPRESSOR) 25 MG tablet    Sig: Take 1 tablet (25 mg total) by mouth 2 (two) times daily.    Dispense:  180 tablet    Refill:  3    Patient Instructions  Medication Instructions:  The current  medical regimen is effective;  continue present plan and medications.  *If you need a refill on your cardiac medications before your next appointment, please call your pharmacy*  Follow-Up: At [facility], you and your health needs are our priority.  As part of our continuing mission to provide you with exceptional heart care, we have created designated Provider Care Teams.  These Care Teams include your primary Cardiologist (physician) and Advanced Practice Providers (APPs -  Physician Assistants and Nurse Practitioners) who all work together to provide you with the care you need, when you need it.  We recommend signing up for the patient portal called "MyChart".  Sign up information is provided on this After Visit Summary.  MyChart is used to connect with patients for Virtual Visits (Telemedicine).  Patients are able to view lab/test results, encounter notes, upcoming appointments, etc.  Non-urgent messages can be sent to your provider as well.   To learn more about what you can do with MyChart, go to [facility].    Your next appointment:   1-2  month(s)  The format for your next appointment:   In Person  Provider:   [email or web address], MD  Thank you for choosing [facility]!!         Signed, [facility], MD  10/25/2020 1:08 PM     [facility]

## 2020-10-25 NOTE — Telephone Encounter (Signed)
Called patient and left message for patient to call back

## 2020-11-01 NOTE — Telephone Encounter (Signed)
Left message for patient to call back  

## 2020-12-16 ENCOUNTER — Other Ambulatory Visit: Payer: Self-pay

## 2020-12-16 ENCOUNTER — Ambulatory Visit: Payer: 59 | Admitting: Cardiology

## 2020-12-16 ENCOUNTER — Encounter: Payer: Self-pay | Admitting: Cardiology

## 2020-12-16 VITALS — BP 124/82 | HR 64 | Ht 64.0 in | Wt 145.4 lb

## 2020-12-16 DIAGNOSIS — I4729 Other ventricular tachycardia: Secondary | ICD-10-CM

## 2020-12-16 DIAGNOSIS — R002 Palpitations: Secondary | ICD-10-CM | POA: Diagnosis not present

## 2020-12-16 DIAGNOSIS — I10 Essential (primary) hypertension: Secondary | ICD-10-CM

## 2020-12-16 DIAGNOSIS — R079 Chest pain, unspecified: Secondary | ICD-10-CM | POA: Diagnosis not present

## 2020-12-16 DIAGNOSIS — R42 Dizziness and giddiness: Secondary | ICD-10-CM

## 2020-12-16 DIAGNOSIS — I472 Ventricular tachycardia: Secondary | ICD-10-CM | POA: Diagnosis not present

## 2020-12-16 NOTE — Patient Instructions (Signed)
Medication Instructions:  Your physician recommends that you continue on your current medications as directed. Please refer to the Current Medication list given to you today.  *If you need a refill on your cardiac medications before your next appointment, please call your pharmacy*  Follow-Up: At [facility], you and your health needs are our priority.  As part of our continuing mission to provide you with exceptional heart care, we have created designated Provider Care Teams.  These Care Teams include your primary Cardiologist (physician) and Advanced Practice Providers (APPs -  Physician Assistants and Nurse Practitioners) who all work together to provide you with the care you need, when you need it.  Follow up with Dr. Turner as needed 

## 2020-12-16 NOTE — Progress Notes (Signed)
Cardiology  Note    Date:  12/16/2020   ID:  Lisa Clarke, DOB [birth year], MRN [id number]  PCP:  [facility]  Cardiologist:  Armanda Magic, MD   Chief Complaint  Patient presents with  . Follow-up    Dizziness, palpitations and HTN    History of Present Illness:  Lisa Clarke is a 35 y.o. female with a hx of HTN , dizzy spells and palpitations.  She has been having dizziness for some time.  It mainly occurs when she changes positions especially if bending over.  It is a spinning sensation that makes her feel bad. The room will start to spin fast.  She also has had problems with chest discomfort that she describes as an ache.  She works out a lot and does certain exercises that strain her chest wall.  Her chest discomfort is worse with chest wall movements.  She can have the pain at night and during the day and is nonexertional.    She has a fm hx of her maternal GF having CAD but no other relative and she has never smoked. She also has had problems with intermittent palpitations.   When I saw her in Aug 2021 ETT showed no ischemia but her BP was elevated.  24 hour BP cuff showed normal BP.  She wore an event monitor that showed 1 run of NSVT and cardiac MRI was ordered but she never followed through. 2D echo showed normal LVF and mild MR.  SHe is here today for followup and is doing well.  She continues have have problems with her chest wall pain.  She continues to work out daily.  She continues to lift heavy weight when she is working out.  It is a stabbing pain and has not changed since I saw her last summer.  She denies any  SOB, DOE, PND, orthopnea, LE edema, dizziness, palpitations or syncope. She is compliant with her meds and is tolerating meds with no SE.    Past Medical History:  Diagnosis Date  . Dizzy spells   . Hypertension   . Mitral valve disease    2D echo 05/2020 with mildly posteriorly directed MR and possible anterior MVP and normal LVF  .  Palpitations   . Sleep disturbance     No past surgical history on file.  Current Medications: Current Meds  Medication Sig  . amLODipine (NORVASC) 2.5 MG tablet Take 1 tablet (2.5 mg total) by mouth daily.    Allergies:   Penicillins   Social History   Socioeconomic History  . Marital status: Single    Spouse name: Not on file  . Number of children: Not on file  . Years of education: Not on file  . Highest education level: Not on file  Occupational History  . Not on file  Tobacco Use  . Smoking status: Never Smoker  . Smokeless tobacco: Never Used  Substance and Sexual Activity  . Alcohol use: Never  . Drug use: Never  . Sexual activity: Yes  Other Topics Concern  . Not on file  Social History Narrative  . Not on file   Social Determinants of Health   Financial Resource Strain: Not on file  Food Insecurity: Not on file  Transportation Needs: Not on file  Physical Activity: Not on file  Stress: Not on file  Social Connections: Not on file     Family History:  The patient's family history includes Hypertension in her mother.  ROS:   Please see the history of present illness.    ROS All other systems reviewed and are negative.  No flowsheet data found.     PHYSICAL EXAM:   VS:  BP 124/82   Pulse 64   Ht 5\' 4"  (1.626 m)   Wt 145 lb 6.4 oz (66 kg)   BMI 24.96 kg/m    GEN: Well nourished, well developed in no acute distress HEENT: Normal NECK: No JVD; No carotid bruits LYMPHATICS: No lymphadenopathy CARDIAC:RRR, no murmurs, rubs, gallops RESPIRATORY:  Clear to auscultation without rales, wheezing or rhonchi  ABDOMEN: Soft, non-tender, non-distended MUSCULOSKELETAL:  No edema; No deformity  SKIN: Warm and dry NEUROLOGIC:  Alert and oriented x 3 PSYCHIATRIC:  Normal affect    Wt Readings from Last 3 Encounters:  12/16/20 145 lb 6.4 oz (66 kg)  10/25/20 146 lb 9.6 oz (66.5 kg)  05/14/20 139 lb 6.4 oz (63.2 kg)      Studies/Labs Reviewed:    EKG:  EKG is ordered today.  The ekg ordered today demonstrates NSR with no ST changes  Recent Labs: No results found for requested labs within last 8760 hours.   Lipid Panel No results found for: CHOL, TRIG, HDL, CHOLHDL, VLDL, LDLCALC, LDLDIRECT  Additional studies/ records that were reviewed today include:  OV notes fromPCP    ASSESSMENT:    1. Palpitations   2. Non-sustained ventricular tachycardia (HCC)   3. Dizziness   4. Essential hypertension   5. Chest pain of uncertain etiology      PLAN:  In order of problems listed above:  1. Palpitations -30 day event monitor showed 1 episode of NSVT -these have improved and are infrequent -ETT showed no ischemia and echo showed normal LVF  2.  Dizziness -her sx are c/w vertigo   3.  HTN -BP is well controlled on exam today -continue amlodipine 2.5mg  daily  4.  Atypical chest pain -I think this is related to costochondritis from weight lifting -her sx are worse with twisting movements of her body, reaching her arms above her chest and deep breathing.  She continues to exercise 5 days a week at least 2 hours and does exercises that do pulling of her chest wall.  I do not think that her CP is cardiac and is c/w MSK pain.   -ETT showed no ischemia -no further cardiac workup -recommend followup with PCP regarding non cardiac CP   Medication Adjustments/Labs and Tests Ordered: Current medicines are reviewed at length with the patient today.  Concerns regarding medicines are outlined above.  Medication changes, Labs and Tests ordered today are listed in the Patient Instructions below.  There are no Patient Instructions on file for this visit.   Signed, 05/16/20, MD  12/16/2020 8:55 AM    [facility] [address] [place], [place], [place]  [place] Phone: [phone]; Fax: [phone]

## 2021-06-11 ENCOUNTER — Other Ambulatory Visit: Payer: Self-pay

## 2021-06-11 ENCOUNTER — Ambulatory Visit (HOSPITAL_COMMUNITY): Payer: 59 | Attending: Cardiology

## 2021-06-11 DIAGNOSIS — R0602 Shortness of breath: Secondary | ICD-10-CM | POA: Insufficient documentation

## 2021-06-11 DIAGNOSIS — I059 Rheumatic mitral valve disease, unspecified: Secondary | ICD-10-CM | POA: Diagnosis not present

## 2021-06-11 LAB — ECHOCARDIOGRAM COMPLETE
Area-P 1/2: 4.39 cm2
MV M vel: 5.29 m/s
MV Peak grad: 111.9 mmHg
Radius: 0.5 cm
S' Lateral: 2.8 cm

## 2021-06-13 ENCOUNTER — Encounter: Payer: Self-pay | Admitting: Cardiology

## 2021-07-09 ENCOUNTER — Telehealth: Payer: Self-pay | Admitting: Cardiology

## 2021-07-09 ENCOUNTER — Other Ambulatory Visit: Payer: Self-pay

## 2021-07-09 MED ORDER — AMLODIPINE BESYLATE 2.5 MG PO TABS: 2.5000 mg | ORAL_TABLET | Freq: Every day | ORAL | 3 refills | Status: AC

## 2021-07-09 NOTE — Telephone Encounter (Signed)
*  STAT* If patient is at the pharmacy, call can be transferred to refill team.   1. Which medications need to be refilled? (please list name of each medication and dose if known) Amlodipine  2. Which pharmacy/location (including street and city if local pharmacy) is medication to be sent to?  [facility]  [place], [place]  3. Do they need a 30 day or 90 day supply? 90 days and refills

## 2021-11-19 ENCOUNTER — Telehealth: Payer: Self-pay

## 2021-11-19 ENCOUNTER — Encounter (HOSPITAL_COMMUNITY): Payer: Self-pay | Admitting: *Deleted

## 2021-11-19 ENCOUNTER — Emergency Department (HOSPITAL_COMMUNITY)
Admission: EM | Admit: 2021-11-19 | Discharge: 2021-11-19 | Disposition: A | Discharge status: Home / Self Care | Payer: 59 | Attending: Emergency Medicine | Admitting: Emergency Medicine

## 2021-11-19 ENCOUNTER — Other Ambulatory Visit: Payer: Self-pay

## 2021-11-19 ENCOUNTER — Emergency Department (HOSPITAL_COMMUNITY): Payer: 59

## 2021-11-19 DIAGNOSIS — Z79899 Other long term (current) drug therapy: Secondary | ICD-10-CM | POA: Insufficient documentation

## 2021-11-19 DIAGNOSIS — R079 Chest pain, unspecified: Secondary | ICD-10-CM | POA: Diagnosis not present

## 2021-11-19 DIAGNOSIS — R0602 Shortness of breath: Secondary | ICD-10-CM | POA: Diagnosis not present

## 2021-11-19 DIAGNOSIS — R0789 Other chest pain: Secondary | ICD-10-CM | POA: Diagnosis not present

## 2021-11-19 DIAGNOSIS — I10 Essential (primary) hypertension: Secondary | ICD-10-CM | POA: Diagnosis not present

## 2021-11-19 LAB — BASIC METABOLIC PANEL
Anion gap: 9 (ref 5–15)
BUN: 10 mg/dL (ref 6–20)
CO2: 24 mmol/L (ref 22–32)
Calcium: 8.9 mg/dL (ref 8.9–10.3)
Chloride: 102 mmol/L (ref 98–111)
Creatinine, Ser: 0.89 mg/dL (ref 0.44–1.00)
GFR, Estimated: >60 mL/min (ref >60)
Glucose, Bld: 88 mg/dL (ref 70–99)
Potassium: 3.8 mmol/L (ref 3.5–5.1)
Sodium: 135 mmol/L (ref 135–145)

## 2021-11-19 LAB — TROPONIN I (HIGH SENSITIVITY)
Troponin I (High Sensitivity): 4 ng/L (ref <18)
Troponin I (High Sensitivity): 3 ng/L (ref <18)

## 2021-11-19 LAB — CBC
HCT: 40.8 % (ref 36.0–46.0)
Hemoglobin: 13.8 g/dL (ref 12.0–15.0)
MCH: 32.1 pg (ref 26.0–34.0)
MCHC: 33.8 g/dL (ref 30.0–36.0)
MCV: 94.9 fL (ref 80.0–100.0)
Platelets: 248 10*3/uL (ref 150–400)
RBC: 4.3 MIL/uL (ref 3.87–5.11)
RDW: 12.1 % (ref 11.5–15.5)
WBC: 4.7 10*3/uL (ref 4.0–10.5)
nRBC: 0 % (ref 0.0–0.2)

## 2021-11-19 LAB — I-STAT BETA HCG BLOOD, ED (MC, WL, AP ONLY): I-stat hCG, quantitative: <5 m[IU]/mL (ref <5)

## 2021-11-19 NOTE — ED Triage Notes (Signed)
Pt reports leaving dentist office and had onset of left side chest pain that radiates into her left arm. States that her arm and hand were numb, unable to grip the steering wheel. Patient had to pull over, felt very sob and then increase in chest discomfort. Numbness to her arm has improved. No acute distress is noted at triage. ?

## 2021-11-19 NOTE — ED Provider Notes (Signed)
[facility] EMERGENCY DEPARTMENT Provider Note   CSN: [id number] Arrival date & time: 11/19/21  1210     History  Chief Complaint  Patient presents with   Chest Pain    Lisa Clarke is a 36 y.o. female. With past medical history of hypertension, mitral valve disease who presents to the emergency department with chest pain.  States that she was driving today when she began having sharp, left-sided chest pain that she states radiated to her left shoulder blade as well as down her left arm.  She states that her left arm became numb and she had difficulty gripping the steering wheel.  She states she dissociated shortness of breath and feeling warm without diaphoresis.  She states that she pulled over for about 10 minutes and stated that the pain calm down.  States that she started driving again, the pain returned, she pulled over and called her sister to come get her.  She denies nausea or vomiting during the incident.  Currently she is having minimal chest pain, although still present.  Of note she did have a dentist appointment this morning, but states that this was a regular cleaning and she did not receive any sedation, other medications or procedures.  She denies lightheadedness, dizziness, syncope, lower extremity swelling.  Denies recent travel, surgery, immobilization, smoking, estrogen use, previous history of DVT or PE.    Notes that she does have history of hypertension which she takes amlodipine for.  Most recent echocardiogram was September 2022 which showed normal EF, mild mitral regurgitation.  She states that she has had intermittent chest pain since she was diagnosed with hypertension last year.  She sees Dr. Mayford Knife, [facility] heart care.    Chest Pain Associated symptoms: shortness of breath   Associated symptoms: no diaphoresis, no dizziness, no fever, no nausea, no palpitations and no vomiting    HPI: A 36 year old patient with a history of hypertension presents  for evaluation of chest pain. Initial onset of pain was less than one hour ago. The patient's chest pain is sharp and is not worse with exertion. The patient reports some diaphoresis. The patient's chest pain is middle- or left-sided, is not well-localized, is not described as heaviness/pressure/tightness and does radiate to the arms/jaw/neck. The patient does not complain of nausea. The patient has no history of stroke, has no history of peripheral artery disease, has not smoked in the past 90 days, denies any history of treated diabetes, has no relevant family history of coronary artery disease (first degree relative at less than age 19), has no history of hypercholesterolemia and does not have an elevated BMI (>=30).   Home Medications Prior to Admission medications   Medication Sig Start Date End Date Taking? Authorizing Provider  amLODipine (NORVASC) 2.5 MG tablet Take 1 tablet (2.5 mg total) by mouth daily. 07/09/21   Quintella Reichert, MD      Allergies    Penicillins    Review of Systems   Review of Systems  Constitutional:  Negative for diaphoresis and fever.  Respiratory:  Positive for shortness of breath.   Cardiovascular:  Positive for chest pain. Negative for palpitations and leg swelling.  Gastrointestinal:  Negative for nausea and vomiting.  Neurological:  Negative for dizziness, syncope and light-headedness.  All other systems reviewed and are negative.  Physical Exam Updated Vital Signs BP 139/88 (BP Location: Right Arm)    Pulse 70    Temp 98.3 F (36.8 C) (Oral)    Resp  15    LMP 11/11/2021 (Approximate)    SpO2 100%  Physical Exam Vitals and nursing note reviewed.  Constitutional:      General: She is not in acute distress.    Appearance: Normal appearance. She is well-developed and normal weight. She is not ill-appearing or toxic-appearing.  HENT:     Head: Normocephalic and atraumatic.     Mouth/Throat:     Mouth: Mucous membranes are moist.     Pharynx:  Oropharynx is clear.  Eyes:     General: No scleral icterus.    Extraocular Movements: Extraocular movements intact.  Neck:     Vascular: No JVD.  Cardiovascular:     Rate and Rhythm: Normal rate and regular rhythm.     Pulses:          Radial pulses are 2+ on the right side and 2+ on the left side.     Heart sounds: Normal heart sounds. No murmur heard. Pulmonary:     Effort: Pulmonary effort is normal. No tachypnea or respiratory distress.     Breath sounds: Normal breath sounds.  Chest:     Chest wall: No tenderness.  Abdominal:     General: Bowel sounds are normal.     Palpations: Abdomen is soft.     Tenderness: There is no abdominal tenderness.  Musculoskeletal:        General: Normal range of motion.     Cervical back: Normal range of motion and neck supple.     Right lower leg: No tenderness. No edema.     Left lower leg: No tenderness. No edema.  Skin:    General: Skin is warm and dry.     Capillary Refill: Capillary refill takes less than 2 seconds.  Neurological:     General: No focal deficit present.     Mental Status: She is alert and oriented to person, place, and time. Mental status is at baseline.  Psychiatric:        Mood and Affect: Mood normal.        Behavior: Behavior normal.        Thought Content: Thought content normal.        Judgment: Judgment normal.    ED Results / Procedures / Treatments   Labs (all labs ordered are listed, but only abnormal results are displayed) Labs Reviewed  BASIC METABOLIC PANEL  CBC  I-STAT BETA HCG BLOOD, ED ([facility], [facility], [facility] ONLY)  TROPONIN I (HIGH SENSITIVITY)  TROPONIN I (HIGH SENSITIVITY)   EKG EKG Interpretation  Date/Time:  Wednesday November 19 2021 12:06:08 EST Ventricular Rate:  72 PR Interval:  156 QRS Duration: 68 QT Interval:  360 QTC Calculation: 394 R Axis:   75 Text Interpretation: Normal sinus rhythm Normal ECG When compared with ECG of 16-Oct-2004 21:52, PREVIOUS ECG IS PRESENT Confirmed by Edwin DadaGray,  Alicia ([id number]) on 11/19/2021 6:50:46 PM  Radiology DG Chest 2 View  Result Date: 11/19/2021 CLINICAL DATA:  Chest pain. EXAM: CHEST - 2 VIEW COMPARISON:  August 06, 2005 FINDINGS: The heart size and mediastinal contours are within normal limits. Both lungs are clear. The visualized skeletal structures are unremarkable. IMPRESSION: No active cardiopulmonary disease. Electronically Signed   By: Ted Mcalpineobrinka  Dimitrova M.D.   On: 11/19/2021 12:42    Procedures Procedures   Medications Ordered in ED Medications - No data to display  ED Course/ Medical Decision Making/ A&P   HEAR Score: 2  Medical Decision Making This patient presents to the ED for concern of chest pain, this involves an extensive number of treatment options, and is a complaint that carries with it a high risk of complications and morbidity.  The differential diagnosis includes ACS, PE, dissection, pericarditis, myocarditis or pneumothorax, pneumonia, etc.  Co morbidities that complicate the patient evaluation Hypertension Mitral valve regurgitation  Additional history obtained:  Additional history obtained from: Sister at bedside External records from outside source obtained and reviewed including: Previous cardiology visits  EKG: EKG: normal EKG, normal sinus rhythm.   Cardiac Monitoring: The patient was maintained on a cardiac monitor.  I personally viewed and interpreted the cardiac monitored which showed an underlying rhythm of: Sinus rhythm  Lab Results: I personally ordered, reviewed, and interpreted labs. Pertinent results include: Troponin x2 negative CBC within normal limits BMP within normal limits Not pregnant  Imaging Studies ordered:  I ordered imaging studies which included x-ray.  I independently reviewed & interpreted imaging & am in agreement with radiology impression. Imaging shows: No evidence of cardiopulmonary process  Consultations: I requested consultation with the  cardiologist, Dr. Wyline Mood,  and discussed lab and imaging findings as well as pertinent plan - they recommend: No changes to medication, recording home blood pressure and chest pain events, follow-up with Dr. Mayford Knife in the outpatient setting  ED Course: 36 year old female who presents to emergency department with chest pain.  Exam without evidence of volume overload so doubt heart failure.  EKG without signs of ischemia or infarction.  Troponin x2 negative.  Doubt ACS at this time.  Presentation is not consistent with acute PE.  PERC negative.  Chest x-ray without pneumothorax.  Presentation is not consistent with thoracic dissection, pericarditis, tamponade, myocarditis.  No infectious symptoms, clear chest x-ray so doubt pneumonia.  HEAR Score: 2    Consulted cardiology as she is a [facility] heart care patient.  Dr. Wyline Mood evaluated the patient at bedside.  They recommend following up outpatient with Dr. Mayford Knife who is her primary cardiologist.  They do not recommend any changes to her home amlodipine at this time.  They also discussed with her at bedside recording her blood pressures at home as well as any chest pain events.  The patient verbalized understanding.  I have given her strict return precautions for worsening of her symptoms to include chest pain or shortness of breath, diaphoresis, nausea, radiation or syncope.  She verbalized understanding.  After consideration of the diagnostic results and the patients response to treatment, I feel that the patent would benefit from discharge. The patient has been appropriately medically screened and/or stabilized in the ED. I have low suspicion for any other emergent medical condition which would require further screening, evaluation or treatment in the ED or require inpatient management. The patient is overall well appearing and non-toxic in appearance. They are hemodynamically stable at time of discharge.   Final Clinical Impression(s) / ED Diagnoses Final  diagnoses:  Atypical chest pain    Rx / DC Orders ED Discharge Orders     None         Cristopher Peru, PA-C 11/19/21 1851    Franne Forts, DO 11/21/21 1027

## 2021-11-19 NOTE — ED Provider Triage Note (Signed)
Emergency Medicine Provider Triage Evaluation Note ? ?Lisa Clarke , a 36 y.o. female  was evaluated in triage.  Pt complains of left-sided chest pain, left arm pain, numbness, feeling of lightheadedness.  She reports that chest pain is persisted with left arm pain, numbness has improved.  She reports that she has had issues with chest pain for years, has had a cardiologist, negative stress test, she does have some noted MVP, MR.  No nausea, vomiting, diaphoresis.  No history of diabetes, high cholesterol, previous ACS, stroke. ? ?Review of Systems  ?Positive: Chest pain, arm numbness ?Negative: [place], [place] ? ?Physical Exam  ?BP 140/89 (BP Location: Left Arm)   Pulse 66   Temp 98.4 ?F (36.9 ?C) (Oral)   Resp 16   LMP 11/11/2021 (Approximate)   SpO2 100%  ?Gen:   Awake, no distress   ?Resp:  Normal effort  ?MSK:   Moves extremities without difficulty  ?Other:  Intact strength bilateral upper extremities.  Cranial nerves II through XII grossly intact.  Some tenderness palpation of the left-sided chest wall. ? ?Medical Decision Making  ?Medically screening exam initiated at 12:29 PM.  Appropriate orders placed.  Lisa Clarke was informed that the remainder of the evaluation will be completed by another provider, this initial triage assessment does not replace that evaluation, and the importance of remaining in the ED until their evaluation is complete. ? ?Workup initiated ?  ?Olene Floss, PA-C ?11/19/21 1231 ? ?

## 2021-11-19 NOTE — Discharge Instructions (Addendum)
You are seen in the emergency department today for chest pain.  While you were here we did a work-up which was reassuring.  You also spoke with Dr. Harl Bowie who is a cardiologist here at [facility].  They recommend no changes to your medications at this time.  They do recommend that you check your blood pressure at home and checking your symptoms.  Please schedule an appointment with Dr. Radford Pax, your primary cardiologist for a follow-up as soon as possible.  Please return to the emergency department for any recurrence of your chest pain with shortness of breath, radiation of pain down her arm or jaw, nausea, sweating. ?

## 2021-11-19 NOTE — ED Notes (Signed)
Pt does not want to be hooked up to EKG monitor ?

## 2021-11-19 NOTE — Telephone Encounter (Signed)
Patient walked into the office today.  ?She reports that she was driving her car this morning and had a sudden onset of sharp chest pain. She states that pain radiated down her left arm. Her left arm went numb and she could not move her fingers. She states that she was flushed and pulled her car over and turned up her A/C. She reports not being able to grip with her left hand at that time. She states that she did have some dizziness as well. No shortness of breath or nausea.  ?She states that currently she has most of the feeling back in her hand and arm now but still some tingling. She still has some slight pain in her chest but no radiation and not as severe. Encouraged patient to go to the ER for evaluation as we do no take any walk-ins. Can otherwise find her next available with DOD or APP. Will make Dr. Mayford Knife aware for any further recommendations.  ? ? ?See previous office note from 12/2020 ?4.  Atypical chest pain ?-I think this is related to costochondritis from weight lifting ?-her sx are worse with twisting movements of her body, reaching her arms above her chest and deep breathing.  She continues to exercise 5 days a week at least 2 hours and does exercises that do pulling of her chest wall.  I do not think that her CP is cardiac and is c/w MSK pain.   ?-ETT showed no ischemia ?-no further cardiac workup ?-recommend followup with PCP regarding non cardiac CP ?

## 2021-11-19 NOTE — Consult Note (Signed)
?Cardiology Consultation:  ? ?Patient ID: Lisa Clarke ?MRN: [id number]; DOB: [birth year] ? ?Admit date: 11/19/2021 ?Date of Consult: 11/19/2021 ? ?PCP:  [facility] ?  ?[facility] ?Cardiologist:  Armanda Magic, MD   { ? ?Patient Profile:  ? ?Lisa Clarke is a 36 y.o. female with a hx of HTN , dizzy spells, chest wall pain, mild MR with prolapse and palpitations who is being seen 11/19/2021 for the evaluation of chest pain at the request of Dr. Wallace Cullens. ? ?ETT 05/2020 showed no ischemia but her BP was elevated.  24 hour BP cuff showed normal BP.  She wore an event monitor 06/2020 that showed 1 run of NSVT and cardiac MRI was ordered but she never followed through. 2D echo 08/2020 showed normal LVF and mild MR. ? ?Last echo 05/2021 showed LVEF of 60-65%, no regional WM abnormality. Mitral annular disjunction (4 mm); mild prolapse of anterior MV  leaflet with mild MR. Findings similar to previous.  ? ?She continues to have chest wall pain when last seen by Dr. Mayford Knife 12/16/2020.  ? ?History of Present Illness:  ? ?Ms. Kington presented for evaluation of chest pain.  Patient had an episode of left-sided chest pain while driving this morning.  She described her pain as sharp in character.  It was radiated to her left arm and then became normal.  She pulled over.  Symptoms resolved within 20 minutes.  She reported palpitation along with chest pain without shortness of breath, nausea or vomiting.  It was more than 10 out of 10 intensity.  She drove herself to ER.  By the time she came to ER her pain was improved to 6 out of 10.  This was felt like prior episode when she was diagnosed with hypertension.  Blood pressure 140/89 on arrival.  Most recent 129/94. ? ?BP 140/89 ?Hs-Troponin negative x 2 ?K 3.8 ?Scr normal ? ?She has Scientist, physiological.  She reported palpitation with chest pain.  I have told to pull up heart rate recording but patient reports " I cannot find it".  Patient wants to go home.  States "I  cannot stay here more.  Why cardiology is called" for evaluation. ? ?Past Medical History:  ?Diagnosis Date  ? Dizzy spells   ? Hypertension   ? Mitral valve disease   ? 2D echo 05/2020 with mildly posteriorly directed MR and possible anterior MVP and normal LVF - no change on echo 05/2021  ? Palpitations   ? Sleep disturbance   ? ?History reviewed. No pertinent surgical history.  ? ?Inpatient Medications: ?Scheduled Meds: ? ?Continuous Infusions: ? ?PRN Meds: ? ? ?Allergies:    ?Allergies  ?Allergen Reactions  ? Penicillins Hives  ? ? ?Social History:   ?Social History  ? ?Socioeconomic History  ? Marital status: Single  ?  Spouse name: Not on file  ? Number of children: Not on file  ? Years of education: Not on file  ? Highest education level: Not on file  ?Occupational History  ? Not on file  ?Tobacco Use  ? Smoking status: Never  ? Smokeless tobacco: Never  ?Substance and Sexual Activity  ? Alcohol use: Never  ? Drug use: Never  ? Sexual activity: Yes  ?Other Topics Concern  ? Not on file  ?Social History Narrative  ? Not on file  ? ?Social Determinants of Health  ? ?Financial Resource Strain: Not on file  ?Food Insecurity: Not on file  ?Transportation Needs: Not on  file  ?Physical Activity: Not on file  ?Stress: Not on file  ?Social Connections: Not on file  ?Intimate Partner Violence: Not on file  ?  ?Family History:   ?Family History  ?Problem Relation Age of Onset  ? Hypertension Mother   ?  ? ?ROS:  ?Please see the history of present illness.  ?All other ROS reviewed and negative.    ? ?Physical Exam/Data:  ? ?Vitals:  ? 11/19/21 1214 11/19/21 1627  ?BP: 140/89 (!) 129/94  ?Pulse: 66 68  ?Resp: 16 15  ?Temp: 98.4 ?F (36.9 ?C) 98.3 ?F (36.8 ?C)  ?TempSrc: Oral Oral  ?SpO2: 100% 100%  ? ?No intake or output data in the 24 hours ending 11/19/21 1656 ?Last 3 Weights 12/16/2020 10/25/2020 05/14/2020  ?Weight (lbs) 145 lb 6.4 oz 146 lb 9.6 oz 139 lb 6.4 oz  ?Weight (kg) 65.953 kg 66.497 kg 63.231 kg  ?   ?There is no  height or weight on file to calculate BMI.  ?General:  Well nourished, well developed, in no acute distress ?HEENT: normal ?Neck: no JVD ?Vascular: No carotid bruits; Distal pulses 2+ bilaterally ?Cardiac:  normal S1, S2; RRR; no murmur  ?Lungs:  clear to auscultation bilaterally, no wheezing, rhonchi or rales  ?Abd: soft, nontender, no hepatomegaly  ?Ext: no edema ?Musculoskeletal:  No deformities, BUE and BLE strength normal and equal ?Skin: warm and dry  ?Neuro:  CNs 2-12 intact, no focal abnormalities noted ?Psych:  Normal affect  ? ?EKG:  The EKG was personally reviewed and demonstrates:  NSR ?Telemetry: Patient is not on telemetry ? ?Relevant CV Studies: ? ?Echo 05/2021 ?1. Mitral annular disjunction (4 mm); mild prolapse of anterior MV  ?leaflet with mild MR. Findings similar to previous.  ? 2. Left ventricular ejection fraction, by estimation, is 60 to 65%. The  ?left ventricle has normal function. The left ventricle has no regional  ?wall motion abnormalities. Left ventricular diastolic parameters were  ?normal.  ? 3. Right ventricular systolic function is normal. The right ventricular  ?size is normal. There is normal pulmonary artery systolic pressure.  ? 4. The mitral valve is normal in structure. Mild mitral valve  ?regurgitation. No evidence of mitral stenosis. There is mild late systolic  ?prolapse of anterior leaflet of the mitral valve.  ? 5. The aortic valve is tricuspid. Aortic valve regurgitation is not  ?visualized. No aortic stenosis is present.  ? 6. The inferior vena cava is normal in size with greater than 50%  ?respiratory variability, suggesting right atrial pressure of 3 mmHg.  ? ?Laboratory Data: ? ?High Sensitivity Troponin:   ?Recent Labs  ?Lab 11/19/21 ?1230 11/19/21 ?1412  ?TROPONINIHS 4 3  ?   ?Chemistry ?Recent Labs  ?Lab 11/19/21 ?1230  ?NA 135  ?K 3.8  ?CL 102  ?CO2 24  ?GLUCOSE 88  ?BUN 10  ?CREATININE 0.89  ?CALCIUM 8.9  ?GFRNONAA >60  ?ANIONGAP 9  ?  ? ?Hematology ?Recent Labs   ?Lab 11/19/21 ?1230  ?WBC 4.7  ?RBC 4.30  ?HGB 13.8  ?HCT 40.8  ?MCV 94.9  ?MCH 32.1  ?MCHC 33.8  ?RDW 12.1  ?PLT 248  ? ?Radiology/Studies:  ?DG Chest 2 View ? ?Result Date: 11/19/2021 ?CLINICAL DATA:  Chest pain. EXAM: CHEST - 2 VIEW COMPARISON:  August 06, 2005 FINDINGS: The heart size and mediastinal contours are within normal limits. Both lungs are clear. The visualized skeletal structures are unremarkable. IMPRESSION: No active cardiopulmonary disease. Electronically Signed   By: Sharlyne Pacas  Dimitrova M.D.   On: 11/19/2021 12:42   ? ?Assessment and Plan:  ? ?Chest pain with associated palpitation ?-Atypical presentation.  Patient has ruled out.  Troponin negative.  EKG without acute ischemic changes.  Patient wears Apple Watch but reports unable to pull heart rate data.  Vital signs stable. ?-No further cardiac work-up recommended ?-Continue home regimen ?-Advised to keep log of heart rate and blood pressure with this episode ? ?2. HTN ?- BP stable on amlodipine  ? ? ?Risk Assessment/Risk Scores:  ? ?HEAR Score (for undifferentiated chest pain):  HEAR Score: 2{ ? ? ?For questions or updates, please contact [facility] ?Please consult [email or web address] for contact info under  ? ? ?Signed, ?Manson PasseyBhavinkumar Adron Geisel, [place]  ?11/19/2021 4:56 PM  ?

## 2021-11-24 ENCOUNTER — Telehealth: Payer: Self-pay | Admitting: Cardiology

## 2021-11-24 NOTE — Telephone Encounter (Signed)
Fax: [phone] ?Attn to: [place] ?Recordings or any test done by Dr. Mayford Knife ?

## 2022-02-03 DIAGNOSIS — H10011 Acute follicular conjunctivitis, right eye: Secondary | ICD-10-CM | POA: Diagnosis not present

## 2022-03-11 DIAGNOSIS — I341 Nonrheumatic mitral (valve) prolapse: Secondary | ICD-10-CM | POA: Diagnosis not present

## 2022-03-18 ENCOUNTER — Other Ambulatory Visit: Payer: Self-pay | Admitting: Internal Medicine

## 2022-03-18 DIAGNOSIS — I341 Nonrheumatic mitral (valve) prolapse: Secondary | ICD-10-CM | POA: Diagnosis not present

## 2022-03-18 DIAGNOSIS — R002 Palpitations: Secondary | ICD-10-CM | POA: Diagnosis not present

## 2022-03-18 DIAGNOSIS — R0602 Shortness of breath: Secondary | ICD-10-CM | POA: Diagnosis not present

## 2022-03-18 DIAGNOSIS — R0789 Other chest pain: Secondary | ICD-10-CM | POA: Diagnosis not present

## 2022-03-25 ENCOUNTER — Inpatient Hospital Stay: Admission: RE | Admit: 2022-03-25 | Payer: 59 | Source: Ambulatory Visit

## 2022-04-15 ENCOUNTER — Inpatient Hospital Stay (HOSPITAL_BASED_OUTPATIENT_CLINIC_OR_DEPARTMENT_OTHER): Admission: RE | Admit: 2022-04-15 | Payer: Self-pay | Source: Ambulatory Visit

## 2022-04-29 ENCOUNTER — Other Ambulatory Visit (HOSPITAL_BASED_OUTPATIENT_CLINIC_OR_DEPARTMENT_OTHER): Payer: Self-pay | Admitting: Internal Medicine

## 2022-04-29 DIAGNOSIS — R0789 Other chest pain: Secondary | ICD-10-CM

## 2022-05-25 ENCOUNTER — Ambulatory Visit (HOSPITAL_BASED_OUTPATIENT_CLINIC_OR_DEPARTMENT_OTHER)
Admission: RE | Admit: 2022-05-25 | Discharge: 2022-05-25 | Disposition: A | Discharge status: Home / Self Care | Payer: Self-pay | Source: Ambulatory Visit | Attending: Internal Medicine | Admitting: Internal Medicine

## 2022-05-25 DIAGNOSIS — R0789 Other chest pain: Secondary | ICD-10-CM | POA: Insufficient documentation

## 2022-07-13 ENCOUNTER — Other Ambulatory Visit
Admission: RE | Admit: 2022-07-13 | Discharge: 2022-07-13 | Disposition: A | Discharge status: Home / Self Care | Payer: Self-pay | Source: Ambulatory Visit | Attending: Student | Admitting: Student

## 2022-07-13 DIAGNOSIS — I10 Essential (primary) hypertension: Secondary | ICD-10-CM | POA: Diagnosis not present

## 2022-07-13 DIAGNOSIS — R079 Chest pain, unspecified: Secondary | ICD-10-CM | POA: Diagnosis not present

## 2022-07-13 DIAGNOSIS — Z8679 Personal history of other diseases of the circulatory system: Secondary | ICD-10-CM | POA: Diagnosis not present

## 2022-07-13 DIAGNOSIS — R0789 Other chest pain: Secondary | ICD-10-CM | POA: Diagnosis not present

## 2022-07-13 LAB — D-DIMER, QUANTITATIVE: D-Dimer, Quant: <0.27 ug{FEU}/mL (ref 0.00–0.50)

## 2022-09-02 DIAGNOSIS — I10 Essential (primary) hypertension: Secondary | ICD-10-CM | POA: Diagnosis not present

## 2022-09-02 DIAGNOSIS — R0789 Other chest pain: Secondary | ICD-10-CM | POA: Diagnosis not present

## 2022-09-02 DIAGNOSIS — Z8679 Personal history of other diseases of the circulatory system: Secondary | ICD-10-CM | POA: Diagnosis not present

## 2022-09-02 DIAGNOSIS — I499 Cardiac arrhythmia, unspecified: Secondary | ICD-10-CM | POA: Diagnosis not present

## 2022-09-14 IMAGING — DX DG CHEST 2V
2 series · 2 of 2 positions shown · non-contrast
Comparison: August 06, 2005

CLINICAL DATA: Chest pain.

EXAM:
CHEST - 2 VIEW

[chest pa]
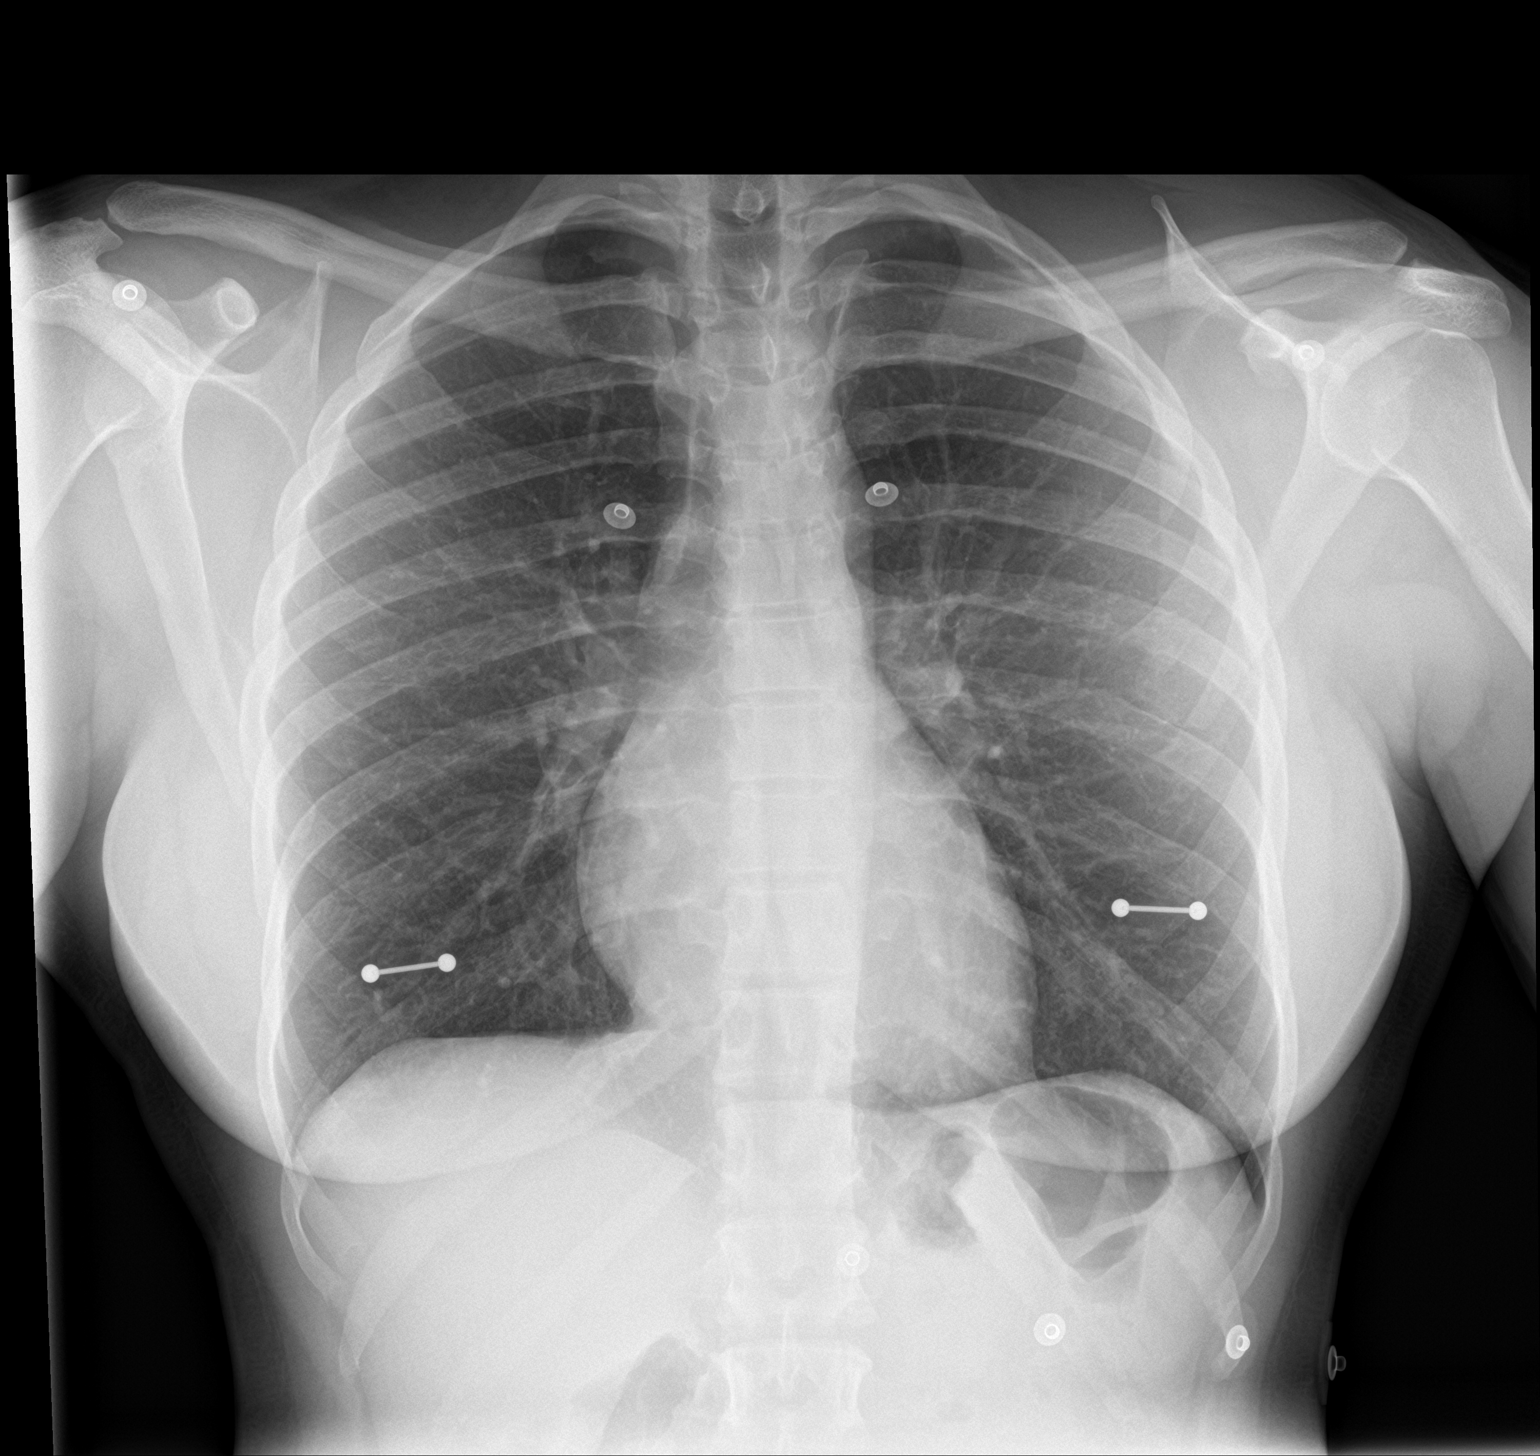

[chest lat]
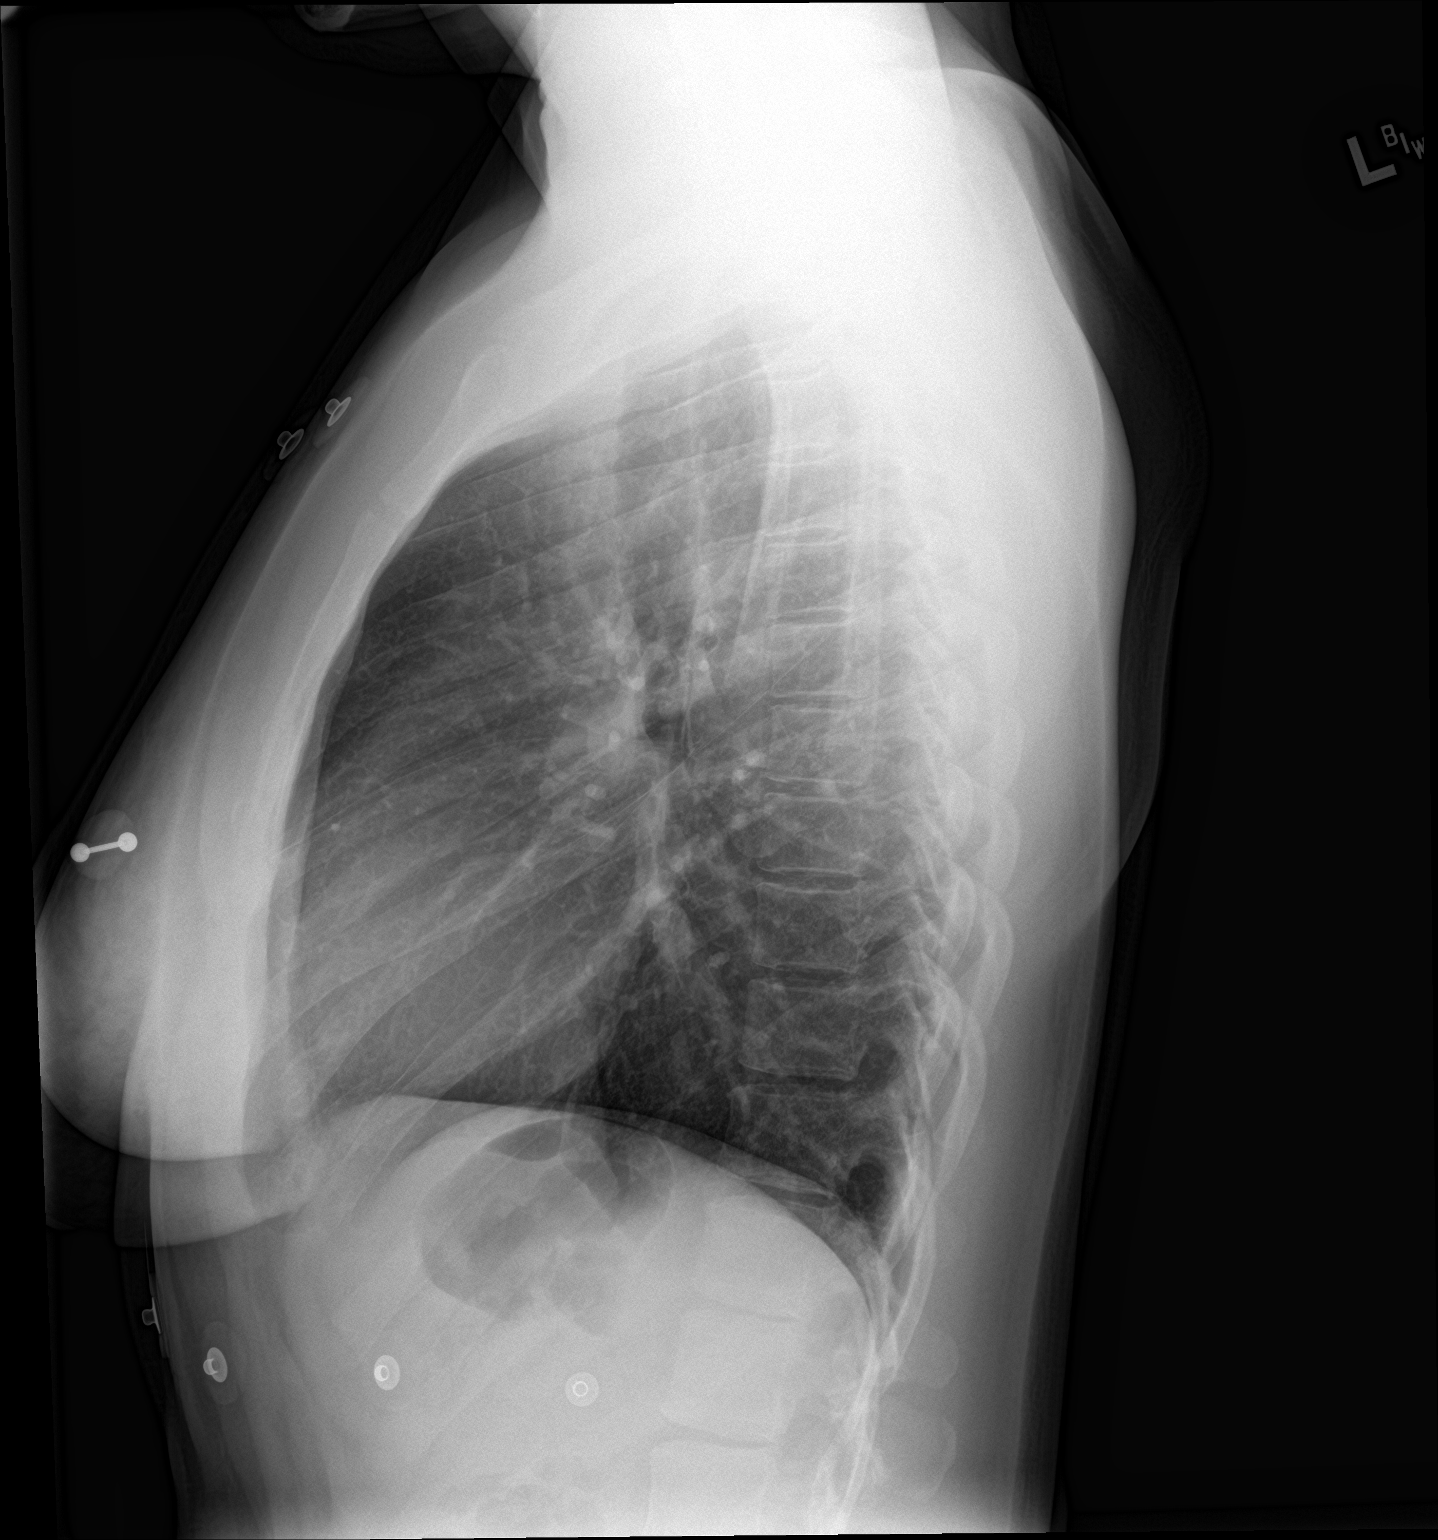

[2 of 2 positions shown; findings below may reference images not displayed]

FINDINGS: The heart size and mediastinal contours are within normal limits.
Both lungs are clear. The visualized skeletal structures are
unremarkable.
IMPRESSION: No active cardiopulmonary disease.

## 2022-09-16 DIAGNOSIS — I341 Nonrheumatic mitral (valve) prolapse: Secondary | ICD-10-CM | POA: Diagnosis not present

## 2022-09-24 DIAGNOSIS — I10 Essential (primary) hypertension: Secondary | ICD-10-CM | POA: Diagnosis not present

## 2022-09-24 DIAGNOSIS — I059 Rheumatic mitral valve disease, unspecified: Secondary | ICD-10-CM | POA: Diagnosis not present

## 2022-09-24 DIAGNOSIS — R0789 Other chest pain: Secondary | ICD-10-CM | POA: Diagnosis not present

## 2023-02-01 DIAGNOSIS — R0789 Other chest pain: Secondary | ICD-10-CM | POA: Diagnosis not present

## 2023-02-01 DIAGNOSIS — I059 Rheumatic mitral valve disease, unspecified: Secondary | ICD-10-CM | POA: Diagnosis not present

## 2023-02-17 DIAGNOSIS — Z0000 Encounter for general adult medical examination without abnormal findings: Secondary | ICD-10-CM | POA: Diagnosis not present

## 2023-02-17 DIAGNOSIS — Z113 Encounter for screening for infections with a predominantly sexual mode of transmission: Secondary | ICD-10-CM | POA: Diagnosis not present

## 2023-02-17 DIAGNOSIS — R053 Chronic cough: Secondary | ICD-10-CM | POA: Diagnosis not present

## 2023-02-17 DIAGNOSIS — I10 Essential (primary) hypertension: Secondary | ICD-10-CM | POA: Diagnosis not present

## 2023-02-17 DIAGNOSIS — Z13220 Encounter for screening for lipoid disorders: Secondary | ICD-10-CM | POA: Diagnosis not present

## 2023-04-12 DIAGNOSIS — I10 Essential (primary) hypertension: Secondary | ICD-10-CM | POA: Diagnosis not present

## 2023-04-12 DIAGNOSIS — R0789 Other chest pain: Secondary | ICD-10-CM | POA: Diagnosis not present

## 2023-07-02 DIAGNOSIS — S99829A Other specified injuries of unspecified foot, initial encounter: Secondary | ICD-10-CM | POA: Diagnosis not present

## 2023-07-02 DIAGNOSIS — M79676 Pain in unspecified toe(s): Secondary | ICD-10-CM | POA: Diagnosis not present

## 2024-06-07 DIAGNOSIS — I499 Cardiac arrhythmia, unspecified: Secondary | ICD-10-CM | POA: Diagnosis not present

## 2024-06-07 DIAGNOSIS — R0789 Other chest pain: Secondary | ICD-10-CM | POA: Diagnosis not present

## 2024-06-07 DIAGNOSIS — I10 Essential (primary) hypertension: Secondary | ICD-10-CM | POA: Diagnosis not present

## 2024-06-07 DIAGNOSIS — R0602 Shortness of breath: Secondary | ICD-10-CM | POA: Diagnosis not present

## 2024-06-19 DIAGNOSIS — R0789 Other chest pain: Secondary | ICD-10-CM | POA: Diagnosis not present
